# Patient Record
Sex: Male | Born: 1995 | Race: Black or African American | Hispanic: No | Marital: Single | Smoking: Current every day smoker
Health system: Southern US, Community
[De-identification: ages and names within clinical notes are randomized; demographics above are authoritative.]

## PROBLEM LIST (undated history)

## (undated) DIAGNOSIS — J45909 Unspecified asthma, uncomplicated: Secondary | ICD-10-CM

## (undated) DIAGNOSIS — T7840XA Allergy, unspecified, initial encounter: Secondary | ICD-10-CM

## (undated) HISTORY — PX: OTHER SURGICAL HISTORY: SHX169

## (undated) HISTORY — DX: Unspecified asthma, uncomplicated: J45.909

## (undated) HISTORY — DX: Allergy, unspecified, initial encounter: T78.40XA

---
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
## 2016-05-26 ENCOUNTER — Encounter (HOSPITAL_COMMUNITY): Payer: Self-pay | Admitting: Emergency Medicine

## 2016-05-26 ENCOUNTER — Emergency Department (HOSPITAL_COMMUNITY)
Admission: EM | Admit: 2016-05-26 | Discharge: 2016-05-27 | Disposition: A | Discharge status: Home / Self Care | Payer: Self-pay | Attending: Emergency Medicine | Admitting: Emergency Medicine

## 2016-05-26 DIAGNOSIS — F17200 Nicotine dependence, unspecified, uncomplicated: Secondary | ICD-10-CM | POA: Insufficient documentation

## 2016-05-26 DIAGNOSIS — J45909 Unspecified asthma, uncomplicated: Secondary | ICD-10-CM | POA: Insufficient documentation

## 2016-05-26 DIAGNOSIS — R360 Urethral discharge without blood: Secondary | ICD-10-CM | POA: Insufficient documentation

## 2016-05-26 DIAGNOSIS — R369 Urethral discharge, unspecified: Secondary | ICD-10-CM

## 2016-05-26 NOTE — ED Triage Notes (Signed)
Pt to ED from home c/o penile discharge. Denies injury, swelling, or burning with urination. Pt ambulatory, in NAD.

## 2016-05-26 NOTE — ED Provider Notes (Signed)
[facility]-EMERGENCY DEPT Provider Note   CSN: [id number] Arrival date & time: 05/26/16  2321  By signing my name below, I, Rosario Adie, attest that this documentation has been prepared under the direction and in the presence of [facility], [place].  Electronically Signed: Rosario Adie, ED Scribe. 05/26/16. 11:57 PM.  History   Chief Complaint Chief Complaint  Patient presents with  . Penile Discharge   The history is provided by the patient. No language interpreter was used.  Penile Discharge  This is a new problem. The current episode started 3 to 5 hours ago. Episode frequency: intermittently  The problem has not changed since onset.Pertinent negatives include no abdominal pain. Nothing aggravates the symptoms. Nothing relieves the symptoms. He has tried nothing for the symptoms.   HPI Comments: Antonio Torres is a 20 y.o. male who presents to the Emergency Department complaining of intermittent episodes of bloody penile discharge with associated sharp penile pain with ejaculation onset tonight PTA. Pt states that he is currently having unprotected sex with two different partners over the past ~3 months. He reports that he has had Chlamydia in the past w/ similar symptoms. No treatments were tried prior to coming into the ED. No recent injury to the groin area. Denies hematuria, dysuria, fever, chills, enuresis, abdominal pain, nausea, vomiting, groin swelling, penile swelling, or any other associated symptoms.   Past Medical History:  Diagnosis Date  . Allergy   . Asthma    There are no active problems to display for this patient.  Past Surgical History:  Procedure Laterality Date  . forehead stitches      Home Medications    Prior to Admission medications   Medication Sig Start Date End Date Taking? Authorizing Provider  albuterol (PROAIR HFA) 108 (90 BASE) MCG/ACT inhaler Inhale 2 puffs into the lungs every 6 (six) hours as needed. PATIENT NEEDS OFFICE VISIT FOR  ADDITIONAL REFILLS 02/09/14   Godfrey Pick, PA-C  albuterol (PROVENTIL HFA;VENTOLIN HFA) 108 (90 BASE) MCG/ACT inhaler Inhale 2 puffs into the lungs every 4 (four) hours as needed for wheezing or shortness of breath. Patient not taking: Reported on 08/16/2014 07/19/14   Harle Battiest, NP  azithromycin (ZITHROMAX) 250 MG tablet Take 1 tablet (250 mg total) by mouth daily. Take first 2 tablets together, then 1 every day until finished. Patient not taking: Reported on 08/16/2014 07/19/14   Harle Battiest, NP  beclomethasone (QVAR) 40 MCG/ACT inhaler Inhale 2 puffs into the lungs 2 (two) times daily. Patient not taking: Reported on 08/16/2014 01/07/13   Nelva Nay, PA-C  predniSONE (DELTASONE) 20 MG tablet Take 2 tablets (40 mg total) by mouth daily. Patient not taking: Reported on 08/16/2014 07/19/14   Harle Battiest, NP   Family History Family History  Problem Relation Age of Onset  . Miscarriages / [place] Mother   . Asthma Father   . Hypertension Father   . Asthma Brother   . Hypertension Paternal Grandmother    Social History Social History  Substance Use Topics  . Smoking status: Current Some Day Smoker  . Smokeless tobacco: Never Used  . Alcohol use Yes     Comment: Occasionally   Allergies   Review of patient's allergies indicates no known allergies.  Review of Systems Review of Systems  Constitutional: Negative for chills and fever.  Gastrointestinal: Negative for abdominal pain, nausea and vomiting.  Genitourinary: Positive for discharge (bloody) and penile pain (w/ ejaculation). Negative for dysuria, enuresis, hematuria, penile swelling,  scrotal swelling and testicular pain.  All other systems reviewed and are negative.  Physical Exam Updated Vital Signs BP 99/64 (BP Location: Left Arm)   Pulse 67   Temp 97.7 F (36.5 C) (Oral)   Resp 18   SpO2 97%   Physical Exam  Constitutional: He appears well-developed and well-nourished.  HENT:  Head:  Normocephalic.  Eyes: Conjunctivae are normal.  Cardiovascular: Normal rate.   Pulmonary/Chest: Effort normal. No respiratory distress.  Abdominal: He exhibits no distension. Hernia confirmed negative in the right inguinal area and confirmed negative in the left inguinal area.  Genitourinary: Testes normal. Right testis shows no swelling and no tenderness. Left testis shows no swelling and no tenderness. Discharge found.  Genitourinary Comments: Chaperone present throughout entire exam. Bee bee sized nodes in the inguinal bilaterally. Small amount of discharge from the urethra.   Musculoskeletal: Normal range of motion.  Lymphadenopathy: Inguinal adenopathy noted on the right and left side.  Neurological: He is alert.  Skin: Skin is warm and dry.  Psychiatric: He has a normal mood and affect. His behavior is normal.  Nursing note and vitals reviewed.  ED Treatments / Results  DIAGNOSTIC STUDIES: Oxygen Saturation is 97% on RA, normal by my interpretation.   COORDINATION OF CARE: 11:57 PM-Discussed next steps with pt. Pt verbalized understanding and is agreeable with the plan.   Labs (all labs ordered are listed, but only abnormal results are displayed) Labs Reviewed  RPR  HIV ANTIBODY (ROUTINE TESTING)  GC/CHLAMYDIA PROBE AMP ([place]    Procedures Procedures   Medications Ordered in ED Medications  cefTRIAXone (ROCEPHIN) injection 250 mg (not administered)  azithromycin (ZITHROMAX) tablet 1,000 mg (not administered)  lidocaine (PF) (XYLOCAINE) 1 % injection (not administered)    Initial Impression / Assessment and Plan / ED Course  I have reviewed the triage vital signs and the nursing notes.  Pertinent lab results that were available during my care of the patient were reviewed by me and considered in my medical decision making (see chart for details).  Clinical Course    Patient treated in the ED for STI with Rocephin and Zithromax. Patient advised  to inform and treat all sexual partners.  Pt advised on safe sex practices and understands that they have GC/Chlamydia cultures pending and will result in 2-3 days. HIV and RPR sent. Pt encouraged to follow up at local health department for future STI checks. No concern for prostatitis or epididymitis. Discussed return precautions. Pt appears safe for discharge. Pt is comfortable with above plan and is stable for discharge at this time. All questions were answered prior to disposition.   Final Clinical Impressions(s) / ED Diagnoses   Final diagnoses:  Penile discharge   New Prescriptions New Prescriptions   No medications on file  I personally performed the services described in this documentation, which was scribed in my presence. The recorded information has been reviewed and is accurate.     [address]Hope AlvaM Mckala Pantaleon, NP 05/27/16 [id number]    Linwood DibblesJon Knapp, MD 05/28/16 2241

## 2016-05-26 NOTE — ED Notes (Signed)
NP at bedside.

## 2016-05-27 LAB — HIV ANTIBODY (ROUTINE TESTING W REFLEX): HIV Screen 4th Generation wRfx: NONREACTIVE

## 2016-05-27 LAB — RPR: RPR: NONREACTIVE

## 2016-05-27 MED ORDER — CEFTRIAXONE SODIUM 250 MG IJ SOLR
250.0000 mg | Freq: Once | INTRAMUSCULAR | Status: AC
  Administered 2016-05-27: 250 mg via INTRAMUSCULAR
  Filled 2016-05-27: qty 250

## 2016-05-27 MED ORDER — LIDOCAINE HCL (PF) 1 % IJ SOLN
INTRAMUSCULAR | Status: AC
  Administered 2016-05-27: 5 mL
  Filled 2016-05-27: qty 5

## 2016-05-27 MED ORDER — AZITHROMYCIN 250 MG PO TABS
1000.0000 mg | ORAL_TABLET | Freq: Once | ORAL | Status: AC
  Administered 2016-05-27: 1000 mg via ORAL
  Filled 2016-05-27: qty 4

## 2016-05-27 NOTE — Discharge Instructions (Signed)
If your symptoms persist follow up with the urologist, Dr. Berneice HeinrichManny.

## 2016-05-27 NOTE — ED Notes (Signed)
Patient verbalized understanding of discharge instructions and denies any further needs or questions at this time. VS stable. Patient ambulatory with steady gait, declined wheelchair.  

## 2016-05-28 ENCOUNTER — Telehealth (HOSPITAL_COMMUNITY): Payer: Self-pay

## 2016-05-28 LAB — GC/CHLAMYDIA PROBE AMP (~~LOC~~) NOT AT ARMC
Chlamydia: POSITIVE — AB
NEISSERIA GONORRHEA: NEGATIVE

## 2016-05-28 NOTE — Telephone Encounter (Signed)
[facility]  (+) Chlamydia  Pt treated with Zithromax and Rocephin.    05/28/16 @0945   Pt ID verified x 2.  Pt informed of dx, tx rcvd appropriate, notify partner & abstain from sex x 10 days.  [employer] form completed and faxed.

## 2016-11-05 ENCOUNTER — Encounter (HOSPITAL_COMMUNITY): Payer: Self-pay | Admitting: Emergency Medicine

## 2016-11-05 ENCOUNTER — Ambulatory Visit (HOSPITAL_COMMUNITY)
Admission: EM | Admit: 2016-11-05 | Discharge: 2016-11-05 | Disposition: A | Discharge status: Home / Self Care | Payer: Medicaid - Out of State | Attending: Family Medicine | Admitting: Family Medicine

## 2016-11-05 DIAGNOSIS — Z711 Person with feared health complaint in whom no diagnosis is made: Secondary | ICD-10-CM

## 2016-11-05 DIAGNOSIS — R369 Urethral discharge, unspecified: Secondary | ICD-10-CM

## 2016-11-05 DIAGNOSIS — Z113 Encounter for screening for infections with a predominantly sexual mode of transmission: Secondary | ICD-10-CM

## 2016-11-05 DIAGNOSIS — J45909 Unspecified asthma, uncomplicated: Secondary | ICD-10-CM | POA: Insufficient documentation

## 2016-11-05 DIAGNOSIS — N509 Disorder of male genital organs, unspecified: Secondary | ICD-10-CM

## 2016-11-05 DIAGNOSIS — Z202 Contact with and (suspected) exposure to infections with a predominantly sexual mode of transmission: Secondary | ICD-10-CM | POA: Insufficient documentation

## 2016-11-05 DIAGNOSIS — R590 Localized enlarged lymph nodes: Secondary | ICD-10-CM

## 2016-11-05 DIAGNOSIS — F17200 Nicotine dependence, unspecified, uncomplicated: Secondary | ICD-10-CM | POA: Insufficient documentation

## 2016-11-05 DIAGNOSIS — R591 Generalized enlarged lymph nodes: Secondary | ICD-10-CM

## 2016-11-05 LAB — CBC WITH DIFFERENTIAL/PLATELET
Basophils Absolute: 0 10*3/uL (ref 0.0–0.1)
Basophils Relative: 1 %
Eosinophils Absolute: 0.3 10*3/uL (ref 0.0–0.7)
Eosinophils Relative: 4 %
HCT: 46.3 % (ref 39.0–52.0)
Hemoglobin: 15.8 g/dL (ref 13.0–17.0)
Lymphocytes Relative: 28 %
Lymphs Abs: 1.6 10*3/uL (ref 0.7–4.0)
MCH: 29.8 pg (ref 26.0–34.0)
MCHC: 34.1 g/dL (ref 30.0–36.0)
MCV: 87.4 fL (ref 78.0–100.0)
MONOS PCT: 9 %
Monocytes Absolute: 0.5 10*3/uL (ref 0.1–1.0)
Neutro Abs: 3.4 10*3/uL (ref 1.7–7.7)
Neutrophils Relative %: 58 %
PLATELETS: 213 10*3/uL (ref 150–400)
RBC: 5.3 MIL/uL (ref 4.22–5.81)
RDW: 13.6 % (ref 11.5–15.5)
WBC: 5.9 10*3/uL (ref 4.0–10.5)

## 2016-11-05 MED ORDER — AZITHROMYCIN 250 MG PO TABS
ORAL_TABLET | ORAL | Status: AC
  Filled 2016-11-05: qty 4

## 2016-11-05 MED ORDER — LIDOCAINE HCL (PF) 1 % IJ SOLN
INTRAMUSCULAR | Status: AC
  Filled 2016-11-05: qty 2

## 2016-11-05 MED ORDER — CEFTRIAXONE SODIUM 250 MG IJ SOLR
250.0000 mg | Freq: Once | INTRAMUSCULAR | Status: AC
  Administered 2016-11-05: 250 mg via INTRAMUSCULAR

## 2016-11-05 MED ORDER — CEFTRIAXONE SODIUM 250 MG IJ SOLR
INTRAMUSCULAR | Status: AC
  Filled 2016-11-05: qty 250

## 2016-11-05 MED ORDER — AZITHROMYCIN 250 MG PO TABS
1000.0000 mg | ORAL_TABLET | Freq: Once | ORAL | Status: AC
  Administered 2016-11-05: 1000 mg via ORAL

## 2016-11-05 NOTE — Discharge Instructions (Signed)
We are testing you today for gonorrhea, chlamydia, HIV, and syphilis. Because if your lymphadenopathy, I'm also doing a CBC with differential, this test checks your white blood cell count. He also treating you today for possible STD exposure, human given azithromycin, and Rocephin. You'll be notified of the results in 3-5 business days. If your symptoms persist, follow-up with a primary care provider, or return to clinic.

## 2016-11-05 NOTE — ED Triage Notes (Signed)
Here for STD check  Reports he has small, painless mass on groin area onset 3 days  Denies penile d/c, dysuria, fevers  Sexually active and is not consistent w/condom use  A&O x4... NAD

## 2016-11-05 NOTE — ED Provider Notes (Signed)
CSN: [id number]     Arrival date & time 11/05/16  1010 History   First MD Initiated Contact with Patient 11/05/16 1122     Chief Complaint  Patient presents with  . Exposure to STD   (Consider location/radiation/quality/duration/timing/severity/associated sxs/prior Treatment) 21 year old male presents to clinic with concern of having swollen lymph nodes in his groin, no also reports having a "clear" penile discharge when he has an erection. He is concerned about possible exposure to STDs, he is concerned about his lymph nodes. He has no rashes, lesions, or growths or other skin conditions on his penis, testicles, or groin.   The history is provided by the patient.  Exposure to STD  This is a new problem. The current episode started more than 1 week ago. The problem occurs constantly. The problem has been gradually worsening. Pertinent negatives include no chest pain and no abdominal pain. Nothing aggravates the symptoms. Nothing relieves the symptoms. He has tried nothing for the symptoms.    Past Medical History:  Diagnosis Date  . Allergy   . Asthma    Past Surgical History:  Procedure Laterality Date  . forehead stitches     Family History  Problem Relation Age of Onset  . Miscarriages / [place] Mother   . Asthma Father   . Hypertension Father   . Asthma Brother   . Hypertension Paternal Grandmother    Social History  Substance Use Topics  . Smoking status: Current Some Day Smoker  . Smokeless tobacco: Never Used  . Alcohol use Yes     Comment: Occasionally    Review of Systems  Constitutional: Negative for chills, fatigue and fever.  Cardiovascular: Negative for chest pain, palpitations and leg swelling.  Gastrointestinal: Negative for abdominal pain, constipation, diarrhea, nausea and vomiting.  Genitourinary: Positive for discharge. Negative for difficulty urinating, dysuria, flank pain, frequency, genital sores, penile pain and penile swelling.   Musculoskeletal: Negative for back pain, myalgias, neck pain and neck stiffness.  Skin: Negative for color change and rash.  Neurological: Negative for dizziness, facial asymmetry and weakness.  Hematological: Positive for adenopathy.  All other systems reviewed and are negative.   Allergies  Patient has no known allergies.  Home Medications   Prior to Admission medications   Medication Sig Start Date End Date Taking? Authorizing Provider  albuterol (PROAIR HFA) 108 (90 BASE) MCG/ACT inhaler Inhale 2 puffs into the lungs every 6 (six) hours as needed. PATIENT NEEDS OFFICE VISIT FOR ADDITIONAL REFILLS 02/09/14   Godfrey PickEleanore E Egan, PA-C  albuterol (PROVENTIL HFA;VENTOLIN HFA) 108 (90 BASE) MCG/ACT inhaler Inhale 2 puffs into the lungs every 4 (four) hours as needed for wheezing or shortness of breath. Patient not taking: Reported on 08/16/2014 07/19/14   Harle BattiestElizabeth Tysinger, NP  beclomethasone (QVAR) 40 MCG/ACT inhaler Inhale 2 puffs into the lungs 2 (two) times daily. Patient not taking: Reported on 08/16/2014 01/07/13   Nelva NayHeather M Marte, PA-C   Meds Ordered and Administered this Visit   Medications  cefTRIAXone (ROCEPHIN) injection 250 mg (250 mg Intramuscular Given 11/05/16 1150)  azithromycin (ZITHROMAX) tablet 1,000 mg (1,000 mg Oral Given 11/05/16 1148)    BP (!) 111/39 (BP Location: Right Arm)   Pulse 64   Temp 97.9 F (36.6 C) (Oral)   Resp 20   SpO2 100%  No data found.   Physical Exam  Constitutional: He is oriented to person, place, and time. He appears well-developed and well-nourished. No distress.  Cardiovascular: Normal rate and regular rhythm.  Pulmonary/Chest: Effort normal and breath sounds normal.  Abdominal: Soft. Bowel sounds are normal. He exhibits no distension. There is no tenderness. There is no guarding. Hernia confirmed negative in the right inguinal area and confirmed negative in the left inguinal area.  Genitourinary: Testes normal and penis normal. Right  testis shows no mass, no swelling and no tenderness. Left testis shows no mass, no swelling and no tenderness. Circumcised. No penile erythema. No discharge found.  Lymphadenopathy: Inguinal adenopathy noted on the right and left side.       Right: Inguinal adenopathy present.       Left: Inguinal adenopathy present.  Neurological: He is alert and oriented to person, place, and time.  Skin: Skin is warm and dry. Capillary refill takes less than 2 seconds. He is not diaphoretic.  Psychiatric: He has a normal mood and affect.  Nursing note and vitals reviewed.   Urgent Care Course     Procedures (including critical care time)  Labs Review Labs Reviewed  CBC WITH DIFFERENTIAL/PLATELET  HIV ANTIBODY (ROUTINE TESTING)  RPR  URINE CYTOLOGY ANCILLARY ONLY    Imaging Review No results found.      MDM   1. Concern about STD in male without diagnosis   2. Lymphadenopathy    Urine cytology collected, blood drawn to check for HIV and RPR. CBC with differential obtained, no abnormalities on the CBC, is treated with azithromycin, and Rocephin in clinic, we'll notify with the results of the tests in 3-5 business days    Dorena Bodo, NP 11/05/16 1337

## 2016-11-06 LAB — URINE CYTOLOGY ANCILLARY ONLY
CHLAMYDIA, DNA PROBE: NEGATIVE
Neisseria Gonorrhea: NEGATIVE
Trichomonas: NEGATIVE

## 2016-11-06 LAB — RPR: RPR Ser Ql: NONREACTIVE

## 2016-11-06 LAB — HIV ANTIBODY (ROUTINE TESTING W REFLEX): HIV SCREEN 4TH GENERATION: NONREACTIVE

## 2016-11-09 LAB — URINE CYTOLOGY ANCILLARY ONLY: BACTERIAL VAGINITIS: NEGATIVE

## 2017-01-17 ENCOUNTER — Encounter (HOSPITAL_COMMUNITY): Payer: Self-pay | Admitting: Emergency Medicine

## 2017-01-17 ENCOUNTER — Ambulatory Visit (HOSPITAL_COMMUNITY)
Admission: EM | Admit: 2017-01-17 | Discharge: 2017-01-17 | Disposition: A | Discharge status: Home / Self Care | Payer: Self-pay | Attending: Internal Medicine | Admitting: Internal Medicine

## 2017-01-17 DIAGNOSIS — Z113 Encounter for screening for infections with a predominantly sexual mode of transmission: Secondary | ICD-10-CM

## 2017-01-17 DIAGNOSIS — R591 Generalized enlarged lymph nodes: Secondary | ICD-10-CM | POA: Insufficient documentation

## 2017-01-17 DIAGNOSIS — J45909 Unspecified asthma, uncomplicated: Secondary | ICD-10-CM | POA: Insufficient documentation

## 2017-01-17 DIAGNOSIS — F17200 Nicotine dependence, unspecified, uncomplicated: Secondary | ICD-10-CM | POA: Insufficient documentation

## 2017-01-17 DIAGNOSIS — R369 Urethral discharge, unspecified: Secondary | ICD-10-CM

## 2017-01-17 LAB — BASIC METABOLIC PANEL
ANION GAP: 7 (ref 5–15)
BUN: 7 mg/dL (ref 6–20)
CO2: 27 mmol/L (ref 22–32)
Calcium: 9.9 mg/dL (ref 8.9–10.3)
Chloride: 104 mmol/L (ref 101–111)
Creatinine, Ser: 1.09 mg/dL (ref 0.61–1.24)
GFR calc non Af Amer: >60 mL/min (ref >60)
Glucose, Bld: 93 mg/dL (ref 65–99)
POTASSIUM: 4 mmol/L (ref 3.5–5.1)
Sodium: 138 mmol/L (ref 135–145)

## 2017-01-17 LAB — CBC WITH DIFFERENTIAL/PLATELET
Basophils Absolute: 0 10*3/uL (ref 0.0–0.1)
Basophils Relative: 0 %
Eosinophils Absolute: 0.3 10*3/uL (ref 0.0–0.7)
Eosinophils Relative: 5 %
HEMATOCRIT: 51.2 % (ref 39.0–52.0)
HEMOGLOBIN: 17.3 g/dL — AB (ref 13.0–17.0)
Lymphocytes Relative: 25 %
Lymphs Abs: 1.8 10*3/uL (ref 0.7–4.0)
MCH: 29.5 pg (ref 26.0–34.0)
MCHC: 33.8 g/dL (ref 30.0–36.0)
MCV: 87.4 fL (ref 78.0–100.0)
Monocytes Absolute: 0.6 10*3/uL (ref 0.1–1.0)
Monocytes Relative: 8 %
NEUTROS ABS: 4.4 10*3/uL (ref 1.7–7.7)
NEUTROS PCT: 62 %
Platelets: 202 10*3/uL (ref 150–400)
RBC: 5.86 MIL/uL — AB (ref 4.22–5.81)
RDW: 13 % (ref 11.5–15.5)
WBC: 7.1 10*3/uL (ref 4.0–10.5)

## 2017-01-17 LAB — SEDIMENTATION RATE: SED RATE: 1 mm/h (ref 0–16)

## 2017-01-17 MED ORDER — STERILE WATER FOR INJECTION IJ SOLN
INTRAMUSCULAR | Status: AC
  Filled 2017-01-17: qty 10

## 2017-01-17 MED ORDER — CIPROFLOXACIN HCL 500 MG PO TABS: 500.0000 mg | ORAL_TABLET | Freq: Two times a day (BID) | ORAL | 0 refills | Status: DC

## 2017-01-17 MED ORDER — AZITHROMYCIN 250 MG PO TABS
1000.0000 mg | ORAL_TABLET | Freq: Once | ORAL | Status: AC
  Administered 2017-01-17: 1000 mg via ORAL

## 2017-01-17 MED ORDER — AZITHROMYCIN 250 MG PO TABS
ORAL_TABLET | ORAL | Status: AC
  Filled 2017-01-17: qty 4

## 2017-01-17 MED ORDER — CEFTRIAXONE SODIUM 250 MG IJ SOLR
250.0000 mg | Freq: Once | INTRAMUSCULAR | Status: AC
  Administered 2017-01-17: 250 mg via INTRAMUSCULAR

## 2017-01-17 MED ORDER — CEFTRIAXONE SODIUM 250 MG IJ SOLR
INTRAMUSCULAR | Status: AC
  Filled 2017-01-17: qty 250

## 2017-01-17 NOTE — Discharge Instructions (Signed)
For your lymphadenopathy, multiple tests have been drawn and run, if there is anything abnormal, we will notify you of the results in 3-5 business days. I recommend you take a daily antihistamine such as Claritin, allegra, or Zyrtec daily, and I have started you on a broad spectrum antibiotic called Cipro, if your lymphadenopathy persists, I have attached the contact information for community health and wellness, he may need further testing and evaluation beyond what is available here in the urgent care setting. Contact them to set up an appointment to establish for primary care.

## 2017-01-17 NOTE — ED Provider Notes (Signed)
CSN: [id number]     Arrival date & time 01/17/17  1226 History   First MD Initiated Contact with Patient 01/17/17 1330     Chief Complaint  Patient presents with  . SEXUALLY TRANSMITTED DISEASE   (Consider location/radiation/quality/duration/timing/severity/associated sxs/prior Treatment) 21 year old male presents to clinic with a chief complaint of concern of possible sexually transmitted disease. I saw this patient on 11/05/2016, at that time he had a chief complaint of swollen lymph nodes in his groin area, and a clear penile discharge. He was treated for, and tested for various STDs living gonorrhea, chlamydia, HIV, syphilis, and Trichomonas. He tested negative for all of these infections, however this lymphadenopathy has remained. He is here today because he is concerned about these lymph nodes, and he wants to be retested, and "treated" for everything. He denies being sexually active in the last 2 months, and states that the few times he had intercourse after the last visit he had consistent condom usage. He denies any dysuria, and he denies any penile discharge. He does state he has a small "bump" on his scrotum, he mashed earlier today and various "white stuff" came out. He denies any fever, chills, loss of appetite, nausea, vomiting, or other systemic symptoms.   The history is provided by the patient.    Past Medical History:  Diagnosis Date  . Allergy   . Asthma    Past Surgical History:  Procedure Laterality Date  . forehead stitches     Family History  Problem Relation Age of Onset  . Miscarriages / [place] Mother   . Asthma Father   . Hypertension Father   . Asthma Brother   . Hypertension Paternal Grandmother    Social History  Substance Use Topics  . Smoking status: Current Some Day Smoker  . Smokeless tobacco: Never Used  . Alcohol use Yes     Comment: Occasionally    Review of Systems  Constitutional: Negative.   HENT: Negative.   Respiratory: Negative.    Cardiovascular: Negative.   Gastrointestinal: Negative.   Genitourinary: Negative.   Musculoskeletal: Negative.   Skin: Negative.   Neurological: Negative.   Hematological: Positive for adenopathy.    Allergies  Patient has no known allergies.  Home Medications   Prior to Admission medications   Medication Sig Start Date End Date Taking? Authorizing Provider  albuterol (PROAIR HFA) 108 (90 BASE) MCG/ACT inhaler Inhale 2 puffs into the lungs every 6 (six) hours as needed. PATIENT NEEDS OFFICE VISIT FOR ADDITIONAL REFILLS 02/09/14  Yes Elsie StainEgan, Eleanore E, PA-C  ciprofloxacin (CIPRO) 500 MG tablet Take 1 tablet (500 mg total) by mouth 2 (two) times daily. 01/17/17   Dorena BodoKennard, Rayhaan Huster, NP   Meds Ordered and Administered this Visit   Medications  cefTRIAXone (ROCEPHIN) injection 250 mg (250 mg Intramuscular Given 01/17/17 1406)  azithromycin (ZITHROMAX) tablet 1,000 mg (1,000 mg Oral Given 01/17/17 1405)    BP 115/60 (BP Location: Left Arm)   Pulse 64   Temp 98.5 F (36.9 C) (Oral)   Resp 16   SpO2 100%  No data found.   Physical Exam  Constitutional: He is oriented to person, place, and time. He appears well-developed and well-nourished. No distress.  HENT:  Head: Normocephalic.  Right Ear: External ear normal.  Left Ear: External ear normal.  Eyes: Conjunctivae are normal.  Cardiovascular: Normal rate and regular rhythm.   Pulmonary/Chest: Effort normal and breath sounds normal.  Genitourinary: Testes normal. Circumcised. No penile erythema. No discharge found.  Lymphadenopathy:       Head (right side): Submandibular adenopathy present.       Head (left side): Submandibular adenopathy present.    He has cervical adenopathy.       Right cervical: Deep cervical adenopathy present.       Right axillary: No pectoral and no lateral adenopathy present.       Left axillary: No pectoral and no lateral adenopathy present.Inguinal adenopathy noted on the right and left side.         Right: Inguinal adenopathy present. No supraclavicular and no epitrochlear adenopathy present.       Left: Inguinal adenopathy present. No supraclavicular and no epitrochlear adenopathy present.  Neurological: He is alert and oriented to person, place, and time.  Skin: Skin is warm and dry. Capillary refill takes less than 2 seconds. He is not diaphoretic.  Psychiatric: He has a normal mood and affect. His behavior is normal.  Nursing note and vitals reviewed.   Urgent Care Course     Procedures (including critical care time)  Labs Review Labs Reviewed  CBC WITH DIFFERENTIAL/PLATELET - Abnormal; Notable for the following:       Result Value   RBC 5.86 (*)    Hemoglobin 17.3 (*)    All other components within normal limits  AEROBIC CULTURE (SUPERFICIAL SPECIMEN)  HSV CULTURE AND TYPING  BASIC METABOLIC PANEL  SEDIMENTATION RATE  RPR  HIV ANTIBODY (ROUTINE TESTING)  URINE CYTOLOGY ANCILLARY ONLY    Imaging Review No results found.      MDM   1. Lymphadenopathy, generalized     Multiple labs drawn, treated in clinic for gonorrhea, and chlamydia. Testing for multiple types of infectious organisms, started on over-the-counter antihistamines, short course of broad-spectrum antibiotics, provided contact information for community health and wellness, encouraged him to follow up with him to establish for primary care, and for further evaluation and investigation of this lymphadenopathy if it persists despite treatment.    Dorena Bodo, NP 01/17/17 2113

## 2017-01-17 NOTE — ED Triage Notes (Signed)
[facility] a [facility] feeling a "BB" in his groin area and believed it to possibly be an STD.

## 2017-01-18 LAB — RPR: RPR Ser Ql: NONREACTIVE

## 2017-01-18 LAB — HIV ANTIBODY (ROUTINE TESTING W REFLEX): HIV Screen 4th Generation wRfx: NONREACTIVE

## 2017-01-19 LAB — AEROBIC CULTURE W GRAM STAIN (SUPERFICIAL SPECIMEN): Culture: NORMAL

## 2017-01-19 LAB — HSV CULTURE AND TYPING

## 2017-01-19 LAB — AEROBIC CULTURE  (SUPERFICIAL SPECIMEN): GRAM STAIN: NONE SEEN

## 2017-03-30 ENCOUNTER — Ambulatory Visit (HOSPITAL_COMMUNITY)
Admission: EM | Admit: 2017-03-30 | Discharge: 2017-03-30 | Disposition: A | Discharge status: Home / Self Care | Payer: Self-pay | Attending: Family Medicine | Admitting: Family Medicine

## 2017-03-30 ENCOUNTER — Encounter (HOSPITAL_COMMUNITY): Payer: Self-pay

## 2017-03-30 ENCOUNTER — Encounter (HOSPITAL_COMMUNITY): Payer: Self-pay | Admitting: Emergency Medicine

## 2017-03-30 ENCOUNTER — Emergency Department (HOSPITAL_COMMUNITY)
Admission: EM | Admit: 2017-03-30 | Discharge: 2017-03-30 | Disposition: A | Discharge status: Home / Self Care | Payer: Self-pay | Attending: Emergency Medicine | Admitting: Emergency Medicine

## 2017-03-30 DIAGNOSIS — R590 Localized enlarged lymph nodes: Secondary | ICD-10-CM | POA: Insufficient documentation

## 2017-03-30 DIAGNOSIS — J45909 Unspecified asthma, uncomplicated: Secondary | ICD-10-CM | POA: Insufficient documentation

## 2017-03-30 DIAGNOSIS — Z79899 Other long term (current) drug therapy: Secondary | ICD-10-CM | POA: Insufficient documentation

## 2017-03-30 DIAGNOSIS — N492 Inflammatory disorders of scrotum: Secondary | ICD-10-CM | POA: Insufficient documentation

## 2017-03-30 DIAGNOSIS — F17210 Nicotine dependence, cigarettes, uncomplicated: Secondary | ICD-10-CM | POA: Insufficient documentation

## 2017-03-30 MED ORDER — SULFAMETHOXAZOLE-TRIMETHOPRIM 800-160 MG PO TABS: 1.0000 | ORAL_TABLET | Freq: Two times a day (BID) | ORAL | 0 refills | Status: AC

## 2017-03-30 NOTE — ED Provider Notes (Signed)
[facility]-EMERGENCY DEPT Provider Note   CSN: [id number] Arrival date & time: 03/30/17  1323    History   Chief Complaint Chief Complaint  Patient presents with  . Lymphadenopathy    HPI Antonio Torres is a 21 y.o. male.  HPI   21 year old Male presents today with scrotal swelling. Patient notes that 4 days ago he started developing swelling to his left groin and upper scrotum. Patient notes he's had these in the past, was diagnosed with lymphadenopathy, but was also diagnosed with swelling of unknown etiology. Patient reports he also has an area on the right side of his scrotum that has been there for several weeks and, he describes as hard, with no surrounding redness, non-painful. Patient reports very minimal redness overlying the area of hardness. He denies any penile discharge, testicular pain or swelling, abdominal pain, nausea vomiting fever or any systemic illnesses.  Past Medical History:  Diagnosis Date  . Allergy   . Asthma     There are no active problems to display for this patient.   Past Surgical History:  Procedure Laterality Date  . forehead stitches         Home Medications    Prior to Admission medications   Medication Sig Start Date End Date Taking? Authorizing Provider  albuterol (PROAIR HFA) 108 (90 BASE) MCG/ACT inhaler Inhale 2 puffs into the lungs every 6 (six) hours as needed. PATIENT NEEDS OFFICE VISIT FOR ADDITIONAL REFILLS 02/09/14   Godfrey Pick, PA-C  ciprofloxacin (CIPRO) 500 MG tablet Take 1 tablet (500 mg total) by mouth 2 (two) times daily. 01/17/17   Dorena Bodo, NP  sulfamethoxazole-trimethoprim (BACTRIM DS,SEPTRA DS) 800-160 MG tablet Take 1 tablet by mouth 2 (two) times daily. 03/30/17 04/06/17  Eyvonne Mechanic, PA-C    Family History Family History  Problem Relation Age of Onset  . Miscarriages / [place] Mother   . Asthma Father   . Hypertension Father   . Asthma Brother   . Hypertension Paternal Grandmother      Social History Social History  Substance Use Topics  . Smoking status: Current Some Day Smoker  . Smokeless tobacco: Never Used  . Alcohol use Yes     Comment: Occasionally     Allergies   Patient has no known allergies.   Review of Systems Review of Systems  All other systems reviewed and are negative.    Physical Exam Updated Vital Signs BP 109/62   Pulse 61   Temp 97.7 F (36.5 C) (Oral)   Resp 16   Ht 5\' 7"  (1.702 m)   Wt 57.6 kg (127 lb)   SpO2 100%   BMI 19.89 kg/m   Physical Exam  Constitutional: He is oriented to person, place, and time. He appears well-developed and well-nourished.  HENT:  Head: Normocephalic and atraumatic.  Eyes: Pupils are equal, round, and reactive to light. Conjunctivae are normal. Right eye exhibits no discharge. Left eye exhibits no discharge. No scleral icterus.  Neck: Normal range of motion. No JVD present. No tracheal deviation present.  Pulmonary/Chest: Effort normal. No stridor.  Genitourinary:  Genitourinary Comments: Induration with small amount of overriding cellulitis to the left scrotum- testicular exam normal with nontender testicles no penile discharge- no inguinal lymphadenopathy  Neurological: He is alert and oriented to person, place, and time. Coordination normal.  Psychiatric: He has a normal mood and affect. His behavior is normal. Judgment and thought content normal.  Nursing note and vitals reviewed.    ED Treatments /  Results  Labs (all labs ordered are listed, but only abnormal results are displayed) Labs Reviewed - No data to display  EKG  EKG Interpretation None       Radiology No results found.  Procedures Procedures (including critical care time)  Medications Ordered in ED Medications - No data to display   Initial Impression / Assessment and Plan / ED Course  I have reviewed the triage vital signs and the nursing notes.  Pertinent labs & imaging results that were available during  my care of the patient were reviewed by me and considered in my medical decision making (see chart for details).      Final Clinical Impressions(s) / ED Diagnoses   Final diagnoses:  Scrotal abscess     Discharge Meds:  Bactrim  Assessment/Plan: 21 year old male presents today with likely infection of the scrotum. He is an area of induration, no fluid collection noted on bedside ultrasound. He has no signs of significant surrounding cellulitis, no signs of Fournier's. Patient is afebrile nontoxic well-appearing. He has a history of the same. Patient will be started on antibiotics, he will follow-up as an outpatient with urology, return immediately if any new or worsening signs or symptoms present. Patient verbalized understanding and agreement to today's plan had no further questions or concerns at time of discharge.      New Prescriptions Discharge Medication List as of 03/30/2017  5:20 PM    START taking these medications   Details  sulfamethoxazole-trimethoprim (BACTRIM DS,SEPTRA DS) 800-160 MG tablet Take 1 tablet by mouth 2 (two) times daily., Starting Tue 03/30/2017, Until Tue 04/06/2017, Print         Kosha Jaquith, [place], PA-C 03/30/17 2039    Geoffery Lyons, MD 03/31/17 1115

## 2017-03-30 NOTE — Discharge Instructions (Signed)
Please read attached information. If you experience any new or worsening signs or symptoms please return to the emergency room for evaluation. Please follow-up with your primary care provider or specialist as discussed. Please use medication prescribed only as directed and discontinue taking if you have any concerning signs or symptoms.   °

## 2017-03-30 NOTE — ED Notes (Signed)
Declined W/C at D/C and was escorted to lobby by RN. 

## 2017-03-30 NOTE — ED Triage Notes (Signed)
PT reports he has been tested twice for STDs and he is negative. PT has enlarged lymph nodes for 1-2 weeks. PT has a "lump" to left groin. PT had similar issues in June and had multiple STD checks. PT denies body aches. PT reports possible fevers.

## 2017-03-30 NOTE — ED Triage Notes (Signed)
Pt sent from u/c for onset 4 days enlarged lump in left groin.  Pt has had two episodes of lumps in groin and STD checks at u/c.  No fevers, abd pain, back pain, or urinary symptoms.

## 2017-03-31 LAB — URINE CYTOLOGY ANCILLARY ONLY
CHLAMYDIA, DNA PROBE: NEGATIVE
NEISSERIA GONORRHEA: NEGATIVE

## 2017-03-31 NOTE — ED Provider Notes (Signed)
[facility]   [id number] 03/30/17 Arrival Time: 1002  ASSESSMENT & PLAN:  1. Lymphadenopathy, inguinal    Unsure of exact etiology but his is apparently not an acute problem. Workup so far has been negative. Discussed that he might benefit from imaging. Will establish with PCP first. No treatment today.  Reviewed expectations re: course of current medical issues. Questions answered. Outlined signs and symptoms indicating need for more acute intervention. Patient verbalized understanding. After Visit Summary given.   SUBJECTIVE:  ATAVION REDSTONE is a 21 y.o. male who presents with complaint of upper scrotal firmness or swelling. For several weeks. No significant pain reported. "A little uncomfortable sometimes." Has been worried about STDs but all testing has been negative. Wants to be tested for GC/Chlamydia again today. No penile d/c. No urinary symptoms or hematuria. Is sexually active with occasional condom use. No abdominal symptoms. Afebrile. No rashes.  ROS: As per HPI.   OBJECTIVE:  Vitals:   03/30/17 1051 03/30/17 1052  BP:  (!) 135/99  Pulse:  64  Resp:  16  Temp:  98.4 F (36.9 C)  TempSrc:  Oral  SpO2:  100%  Weight: 125 lb (56.7 kg)   Height: 5\' 7"  (1.702 m)      General appearance: alert; no distress Abdomen: soft, non-tender; bowel sounds normal; no masses or organomegaly; no guarding or rebound tenderness GU: both testicles feel normal; upper scrotum bilaterally with slight thickening; non-tender; no masses appreciated; no hernia; tolerates exam well Back: no CVA tenderness Extremities: no cyanosis or edema; symmetrical with no gross deformities Skin: warm and dry Psychological:  alert and cooperative; normal mood and affect  Results for orders placed or performed during the hospital encounter of 01/17/17  Wound or Superficial Culture  Result Value Ref Range   Specimen Description PUSTULE    Special Requests NONE    Gram Stain NO WBC  SEEN NO ORGANISMS SEEN     Culture NORMAL SKIN FLORA    Report Status 01/19/2017 FINAL   Hsv Culture And Typing  Result Value Ref Range   HSV Culture/Type Comment    Source of Sample PUSTULE   CBC with Differential  Result Value Ref Range   WBC 7.1 4.0 - 10.5 K/uL   RBC 5.86 (H) 4.22 - 5.81 MIL/uL   Hemoglobin 17.3 (H) 13.0 - 17.0 g/dL   HCT 79.3 90.3 - 00.9 %   MCV 87.4 78.0 - 100.0 fL   MCH 29.5 26.0 - 34.0 pg   MCHC 33.8 30.0 - 36.0 g/dL   RDW 23.3 00.7 - 62.2 %   Platelets 202 150 - 400 K/uL   Neutrophils Relative % 62 %   Neutro Abs 4.4 1.7 - 7.7 K/uL   Lymphocytes Relative 25 %   Lymphs Abs 1.8 0.7 - 4.0 K/uL   Monocytes Relative 8 %   Monocytes Absolute 0.6 0.1 - 1.0 K/uL   Eosinophils Relative 5 %   Eosinophils Absolute 0.3 0.0 - 0.7 K/uL   Basophils Relative 0 %   Basophils Absolute 0.0 0.0 - 0.1 K/uL  Basic metabolic panel  Result Value Ref Range   Sodium 138 135 - 145 mmol/L   Potassium 4.0 3.5 - 5.1 mmol/L   Chloride 104 101 - 111 mmol/L   CO2 27 22 - 32 mmol/L   Glucose, Bld 93 65 - 99 mg/dL   BUN 7 6 - 20 mg/dL   Creatinine, Ser 6.33 0.61 - 1.24 mg/dL   Calcium 9.9 8.9 -  10.3 mg/dL   GFR calc non Af Amer >60 >60 mL/min   GFR calc Af Amer >60 >60 mL/min   Anion gap 7 5 - 15  RPR  Result Value Ref Range   RPR Ser Ql Non Reactive Non Reactive  HIV antibody  Result Value Ref Range   HIV Screen 4th Generation wRfx Non Reactive Non Reactive  Sedimentation rate  Result Value Ref Range   Sed Rate 1 0 - 16 mm/hr    Labs Reviewed  URINE CYTOLOGY ANCILLARY ONLY    No Known Allergies  PMHx, SurgHx, SocialHx, Medications, and Allergies were reviewed in the Visit Navigator and updated as appropriate.      Mardella Layman, MD 03/31/17 1000

## 2018-01-06 ENCOUNTER — Inpatient Hospital Stay (HOSPITAL_COMMUNITY)
Admission: EM | Admit: 2018-01-06 | Discharge: 2018-01-17 | DRG: 579 | Disposition: A | Discharge status: Home Health | Payer: Medicaid Other | Attending: General Surgery | Admitting: General Surgery

## 2018-01-06 DIAGNOSIS — M21372 Foot drop, left foot: Secondary | ICD-10-CM | POA: Diagnosis present

## 2018-01-06 DIAGNOSIS — R58 Hemorrhage, not elsewhere classified: Secondary | ICD-10-CM | POA: Diagnosis present

## 2018-01-06 DIAGNOSIS — S75101A Unspecified injury of femoral vein at hip and thigh level, right leg, initial encounter: Secondary | ICD-10-CM | POA: Diagnosis present

## 2018-01-06 DIAGNOSIS — D62 Acute posthemorrhagic anemia: Secondary | ICD-10-CM | POA: Diagnosis present

## 2018-01-06 DIAGNOSIS — E876 Hypokalemia: Secondary | ICD-10-CM | POA: Diagnosis not present

## 2018-01-06 DIAGNOSIS — S71131A Puncture wound without foreign body, right thigh, initial encounter: Secondary | ICD-10-CM | POA: Diagnosis present

## 2018-01-06 DIAGNOSIS — J45909 Unspecified asthma, uncomplicated: Secondary | ICD-10-CM | POA: Diagnosis present

## 2018-01-06 DIAGNOSIS — W3400XA Accidental discharge from unspecified firearms or gun, initial encounter: Secondary | ICD-10-CM

## 2018-01-06 DIAGNOSIS — J9690 Respiratory failure, unspecified, unspecified whether with hypoxia or hypercapnia: Secondary | ICD-10-CM

## 2018-01-06 DIAGNOSIS — D696 Thrombocytopenia, unspecified: Secondary | ICD-10-CM | POA: Diagnosis present

## 2018-01-06 DIAGNOSIS — R402362 Coma scale, best motor response, obeys commands, at arrival to emergency department: Secondary | ICD-10-CM | POA: Diagnosis present

## 2018-01-06 DIAGNOSIS — R402142 Coma scale, eyes open, spontaneous, at arrival to emergency department: Secondary | ICD-10-CM | POA: Diagnosis present

## 2018-01-06 DIAGNOSIS — S71132A Puncture wound without foreign body, left thigh, initial encounter: Principal | ICD-10-CM | POA: Diagnosis present

## 2018-01-06 DIAGNOSIS — R402252 Coma scale, best verbal response, oriented, at arrival to emergency department: Secondary | ICD-10-CM | POA: Diagnosis present

## 2018-01-06 DIAGNOSIS — Y9289 Other specified places as the place of occurrence of the external cause: Secondary | ICD-10-CM

## 2018-01-06 DIAGNOSIS — S3133XA Puncture wound without foreign body of scrotum and testes, initial encounter: Secondary | ICD-10-CM | POA: Diagnosis present

## 2018-01-06 DIAGNOSIS — S7011XA Contusion of right thigh, initial encounter: Secondary | ICD-10-CM | POA: Diagnosis present

## 2018-01-06 DIAGNOSIS — N5089 Other specified disorders of the male genital organs: Secondary | ICD-10-CM | POA: Diagnosis present

## 2018-01-06 DIAGNOSIS — T794XXA Traumatic shock, initial encounter: Secondary | ICD-10-CM | POA: Diagnosis present

## 2018-01-06 MED ORDER — SODIUM CHLORIDE 0.9 % IV SOLN
INTRAVENOUS | Status: AC | PRN
  Administered 2018-01-06: 125 mL/h via INTRAVENOUS
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
## 2018-01-06 NOTE — ED Notes (Signed)
Unit 1 [facility] start via [place]

## 2018-01-07 ENCOUNTER — Emergency Department (HOSPITAL_COMMUNITY): Payer: Medicaid Other

## 2018-01-07 ENCOUNTER — Emergency Department (HOSPITAL_COMMUNITY): Payer: Medicaid Other | Admitting: Anesthesiology

## 2018-01-07 ENCOUNTER — Encounter (HOSPITAL_COMMUNITY): Payer: Self-pay | Admitting: Emergency Medicine

## 2018-01-07 ENCOUNTER — Encounter (HOSPITAL_COMMUNITY): Admission: EM | Disposition: A | Discharge status: Home Health | Payer: Self-pay | Source: Home / Self Care

## 2018-01-07 ENCOUNTER — Other Ambulatory Visit (HOSPITAL_COMMUNITY): Payer: Self-pay

## 2018-01-07 ENCOUNTER — Inpatient Hospital Stay (HOSPITAL_COMMUNITY): Payer: Medicaid Other

## 2018-01-07 DIAGNOSIS — R402362 Coma scale, best motor response, obeys commands, at arrival to emergency department: Secondary | ICD-10-CM | POA: Diagnosis present

## 2018-01-07 DIAGNOSIS — J45909 Unspecified asthma, uncomplicated: Secondary | ICD-10-CM | POA: Diagnosis present

## 2018-01-07 DIAGNOSIS — S71132A Puncture wound without foreign body, left thigh, initial encounter: Secondary | ICD-10-CM | POA: Diagnosis present

## 2018-01-07 DIAGNOSIS — D696 Thrombocytopenia, unspecified: Secondary | ICD-10-CM | POA: Diagnosis present

## 2018-01-07 DIAGNOSIS — T794XXA Traumatic shock, initial encounter: Secondary | ICD-10-CM | POA: Diagnosis present

## 2018-01-07 DIAGNOSIS — Y9289 Other specified places as the place of occurrence of the external cause: Secondary | ICD-10-CM | POA: Diagnosis not present

## 2018-01-07 DIAGNOSIS — M79652 Pain in left thigh: Secondary | ICD-10-CM | POA: Diagnosis present

## 2018-01-07 DIAGNOSIS — S3133XA Puncture wound without foreign body of scrotum and testes, initial encounter: Secondary | ICD-10-CM | POA: Diagnosis present

## 2018-01-07 DIAGNOSIS — R402142 Coma scale, eyes open, spontaneous, at arrival to emergency department: Secondary | ICD-10-CM | POA: Diagnosis present

## 2018-01-07 DIAGNOSIS — S75192A Other specified injury of femoral vein at hip and thigh level, left leg, initial encounter: Secondary | ICD-10-CM

## 2018-01-07 DIAGNOSIS — D62 Acute posthemorrhagic anemia: Secondary | ICD-10-CM | POA: Diagnosis present

## 2018-01-07 DIAGNOSIS — S7011XA Contusion of right thigh, initial encounter: Secondary | ICD-10-CM | POA: Diagnosis present

## 2018-01-07 DIAGNOSIS — E876 Hypokalemia: Secondary | ICD-10-CM | POA: Diagnosis not present

## 2018-01-07 DIAGNOSIS — N5089 Other specified disorders of the male genital organs: Secondary | ICD-10-CM | POA: Diagnosis present

## 2018-01-07 DIAGNOSIS — R402252 Coma scale, best verbal response, oriented, at arrival to emergency department: Secondary | ICD-10-CM | POA: Diagnosis present

## 2018-01-07 DIAGNOSIS — M21372 Foot drop, left foot: Secondary | ICD-10-CM | POA: Diagnosis present

## 2018-01-07 DIAGNOSIS — S71131A Puncture wound without foreign body, right thigh, initial encounter: Secondary | ICD-10-CM | POA: Diagnosis present

## 2018-01-07 DIAGNOSIS — S75101A Unspecified injury of femoral vein at hip and thigh level, right leg, initial encounter: Secondary | ICD-10-CM | POA: Diagnosis present

## 2018-01-07 DIAGNOSIS — W3400XA Accidental discharge from unspecified firearms or gun, initial encounter: Secondary | ICD-10-CM | POA: Diagnosis not present

## 2018-01-07 DIAGNOSIS — R58 Hemorrhage, not elsewhere classified: Secondary | ICD-10-CM | POA: Diagnosis present

## 2018-01-07 HISTORY — PX: FEMORAL ARTERY EXPLORATION: SHX5160

## 2018-01-07 LAB — CBC
HCT: 22 % — ABNORMAL LOW (ref 39.0–52.0)
HEMATOCRIT: 25.8 % — AB (ref 39.0–52.0)
HEMATOCRIT: 29.6 % — AB (ref 39.0–52.0)
HEMATOCRIT: 32.3 % — AB (ref 39.0–52.0)
HEMATOCRIT: 34.9 % — AB (ref 39.0–52.0)
HEMOGLOBIN: 10.2 g/dL — AB (ref 13.0–17.0)
HEMOGLOBIN: 9.2 g/dL — AB (ref 13.0–17.0)
Hemoglobin: 11.8 g/dL — ABNORMAL LOW (ref 13.0–17.0)
Hemoglobin: 7.6 g/dL — ABNORMAL LOW (ref 13.0–17.0)
Hemoglobin: 8.8 g/dL — ABNORMAL LOW (ref 13.0–17.0)
MCH: 28.9 pg (ref 26.0–34.0)
MCH: 29.1 pg (ref 26.0–34.0)
MCH: 29.2 pg (ref 26.0–34.0)
MCH: 29.2 pg (ref 26.0–34.0)
MCH: 29.3 pg (ref 26.0–34.0)
MCHC: 28.5 g/dL — AB (ref 30.0–36.0)
MCHC: 33.8 g/dL (ref 30.0–36.0)
MCHC: 34.1 g/dL (ref 30.0–36.0)
MCHC: 34.5 g/dL (ref 30.0–36.0)
MCHC: 34.5 g/dL (ref 30.0–36.0)
MCV: 101.6 fL — AB (ref 78.0–100.0)
MCV: 84.3 fL (ref 78.0–100.0)
MCV: 84.8 fL (ref 78.0–100.0)
MCV: 85.7 fL (ref 78.0–100.0)
MCV: 86.6 fL (ref 78.0–100.0)
PLATELETS: 76 10*3/uL — AB (ref 150–400)
PLATELETS: 49 10*3/uL — AB (ref 150–400)
Platelets: 169 10*3/uL (ref 150–400)
Platelets: 49 10*3/uL — ABNORMAL LOW (ref 150–400)
Platelets: 69 10*3/uL — ABNORMAL LOW (ref 150–400)
RBC: 2.61 MIL/uL — AB (ref 4.22–5.81)
RBC: 3.01 MIL/uL — ABNORMAL LOW (ref 4.22–5.81)
RBC: 3.18 MIL/uL — ABNORMAL LOW (ref 4.22–5.81)
RBC: 3.49 MIL/uL — ABNORMAL LOW (ref 4.22–5.81)
RBC: 4.03 MIL/uL — ABNORMAL LOW (ref 4.22–5.81)
RDW: 13 % (ref 11.5–15.5)
RDW: 14.4 % (ref 11.5–15.5)
RDW: 14.5 % (ref 11.5–15.5)
RDW: 14.6 % (ref 11.5–15.5)
RDW: 14.6 % (ref 11.5–15.5)
WBC: 5.6 10*3/uL (ref 4.0–10.5)
WBC: 6.5 10*3/uL (ref 4.0–10.5)
WBC: 6.9 10*3/uL (ref 4.0–10.5)
WBC: 7 10*3/uL (ref 4.0–10.5)
WBC: 7.7 10*3/uL (ref 4.0–10.5)

## 2018-01-07 LAB — POCT I-STAT 7, (LYTES, BLD GAS, ICA,H+H)
ACID-BASE DEFICIT: 8 mmol/L — AB (ref 0.0–2.0)
Acid-base deficit: 9 mmol/L — ABNORMAL HIGH (ref 0.0–2.0)
BICARBONATE: 19.3 mmol/L — AB (ref 20.0–28.0)
Bicarbonate: 19.9 mmol/L — ABNORMAL LOW (ref 20.0–28.0)
CALCIUM ION: 0.67 mmol/L — AB (ref 1.15–1.40)
Calcium, Ion: 0.84 mmol/L — CL (ref 1.15–1.40)
HCT: 28 % — ABNORMAL LOW (ref 39.0–52.0)
HCT: 35 % — ABNORMAL LOW (ref 39.0–52.0)
HEMOGLOBIN: 11.9 g/dL — AB (ref 13.0–17.0)
HEMOGLOBIN: 9.5 g/dL — AB (ref 13.0–17.0)
O2 SAT: 89 %
O2 Saturation: 95 %
PCO2 ART: 42.1 mmHg (ref 32.0–48.0)
PCO2 ART: 50.5 mmHg — AB (ref 32.0–48.0)
PH ART: 7.188 — AB (ref 7.350–7.450)
PH ART: 7.257 — AB (ref 7.350–7.450)
PO2 ART: 61 mmHg — AB (ref 83.0–108.0)
PO2 ART: 76 mmHg — AB (ref 83.0–108.0)
POTASSIUM: 4.3 mmol/L (ref 3.5–5.1)
Potassium: 3.8 mmol/L (ref 3.5–5.1)
Sodium: 147 mmol/L — ABNORMAL HIGH (ref 135–145)
Sodium: 147 mmol/L — ABNORMAL HIGH (ref 135–145)
TCO2: 21 mmol/L — ABNORMAL LOW (ref 22–32)
TCO2: 22 mmol/L (ref 22–32)

## 2018-01-07 LAB — COMPREHENSIVE METABOLIC PANEL
ALBUMIN: 3.3 g/dL — AB (ref 3.5–5.0)
ALT: 25 U/L (ref 17–63)
AST: 43 U/L — AB (ref 15–41)
Alkaline Phosphatase: 72 U/L (ref 38–126)
Anion gap: 28 — ABNORMAL HIGH (ref 5–15)
BUN: 9 mg/dL (ref 6–20)
CHLORIDE: 113 mmol/L — AB (ref 101–111)
CO2: 12 mmol/L — ABNORMAL LOW (ref 22–32)
Calcium: 9.6 mg/dL (ref 8.9–10.3)
Creatinine, Ser: 1.52 mg/dL — ABNORMAL HIGH (ref 0.61–1.24)
GFR calc Af Amer: 31 mL/min — ABNORMAL LOW (ref >60)
GFR calc non Af Amer: 26 mL/min — ABNORMAL LOW (ref >60)
GLUCOSE: 166 mg/dL — AB (ref 65–99)
POTASSIUM: 4.4 mmol/L (ref 3.5–5.1)
SODIUM: 153 mmol/L — AB (ref 135–145)
Total Bilirubin: 0.7 mg/dL (ref 0.3–1.2)
Total Protein: 5.2 g/dL — ABNORMAL LOW (ref 6.5–8.1)

## 2018-01-07 LAB — I-STAT ARTERIAL BLOOD GAS, ED
Acid-base deficit: 12 mmol/L — ABNORMAL HIGH (ref 0.0–2.0)
BICARBONATE: 20.1 mmol/L (ref 20.0–28.0)
O2 SAT: 81 %
PCO2 ART: 72.1 mmHg — AB (ref 32.0–48.0)
PH ART: 7.047 — AB (ref 7.350–7.450)
Patient temperature: 96.9
TCO2: 22 mmol/L (ref 22–32)
pO2, Arterial: 62 mmHg — ABNORMAL LOW (ref 83.0–108.0)

## 2018-01-07 LAB — I-STAT CHEM 8, ED
BUN: 8 mg/dL (ref 6–20)
CHLORIDE: 112 mmol/L — AB (ref 101–111)
CREATININE: 1.3 mg/dL — AB (ref 0.61–1.24)
Calcium, Ion: 1.13 mmol/L — ABNORMAL LOW (ref 1.15–1.40)
Glucose, Bld: 157 mg/dL — ABNORMAL HIGH (ref 65–99)
HEMATOCRIT: 31 % — AB (ref 39.0–52.0)
Hemoglobin: 10.5 g/dL — ABNORMAL LOW (ref 13.0–17.0)
POTASSIUM: 4.1 mmol/L (ref 3.5–5.1)
SODIUM: 150 mmol/L — AB (ref 135–145)
TCO2: 14 mmol/L — ABNORMAL LOW (ref 22–32)

## 2018-01-07 LAB — PROTIME-INR
INR: 1.32
INR: 1.38
INR: 1.8
PROTHROMBIN TIME: 20.7 s — AB (ref 11.4–15.2)
Prothrombin Time: 16.8 seconds — ABNORMAL HIGH (ref 11.4–15.2)
Prothrombin Time: 16.2 seconds — ABNORMAL HIGH (ref 11.4–15.2)

## 2018-01-07 LAB — DIC (DISSEMINATED INTRAVASCULAR COAGULATION) PANEL
APTT: 40 s — AB (ref 24–36)
FIBRINOGEN: 239 mg/dL (ref 210–475)
FIBRINOGEN: 233 mg/dL (ref 210–475)
INR: 1.25
PLATELETS: 107 10*3/uL — AB (ref 150–400)
PROTHROMBIN TIME: 15.6 s — AB (ref 11.4–15.2)
SMEAR REVIEW: NONE SEEN

## 2018-01-07 LAB — POCT I-STAT 3, ART BLOOD GAS (G3+)
ACID-BASE DEFICIT: 3 mmol/L — AB (ref 0.0–2.0)
Bicarbonate: 23 mmol/L (ref 20.0–28.0)
O2 Saturation: 100 %
PH ART: 7.312 — AB (ref 7.350–7.450)
PO2 ART: 210 mmHg — AB (ref 83.0–108.0)
TCO2: 24 mmol/L (ref 22–32)
pCO2 arterial: 45.9 mmHg (ref 32.0–48.0)

## 2018-01-07 LAB — URINALYSIS, ROUTINE W REFLEX MICROSCOPIC
BACTERIA UA: NONE SEEN
Bilirubin Urine: NEGATIVE
Glucose, UA: >=500 mg/dL — AB
KETONES UR: NEGATIVE mg/dL
Leukocytes, UA: NEGATIVE
NITRITE: NEGATIVE
PH: 6 (ref 5.0–8.0)
Protein, ur: NEGATIVE mg/dL
SPECIFIC GRAVITY, URINE: 1.014 (ref 1.005–1.030)

## 2018-01-07 LAB — ABO/RH: ABO/RH(D): O POS

## 2018-01-07 LAB — BASIC METABOLIC PANEL
Anion gap: 10 (ref 5–15)
BUN: 7 mg/dL (ref 6–20)
CALCIUM: 7.2 mg/dL — AB (ref 8.9–10.3)
CO2: 21 mmol/L — AB (ref 22–32)
CREATININE: 0.87 mg/dL (ref 0.61–1.24)
Chloride: 111 mmol/L (ref 101–111)
GFR calc Af Amer: >60 mL/min (ref >60)
GLUCOSE: 108 mg/dL — AB (ref 65–99)
Potassium: 3.3 mmol/L — ABNORMAL LOW (ref 3.5–5.1)
Sodium: 142 mmol/L (ref 135–145)

## 2018-01-07 LAB — LACTIC ACID, PLASMA: Lactic Acid, Venous: 1.7 mmol/L (ref 0.5–1.9)

## 2018-01-07 LAB — I-STAT CG4 LACTIC ACID, ED
LACTIC ACID, VENOUS: 13 mmol/L — AB (ref 0.5–1.9)
LACTIC ACID, VENOUS: 7.6 mmol/L — AB (ref 0.5–1.9)
Lactic Acid, Venous: >17 mmol/L (ref 0.5–1.9)

## 2018-01-07 LAB — TRIGLYCERIDES: TRIGLYCERIDES: 71 mg/dL (ref <150)

## 2018-01-07 LAB — HIV ANTIBODY (ROUTINE TESTING W REFLEX): HIV Screen 4th Generation wRfx: NONREACTIVE

## 2018-01-07 LAB — HEMOGLOBIN AND HEMATOCRIT, BLOOD
HEMATOCRIT: 37.1 % — AB (ref 39.0–52.0)
HEMOGLOBIN: 11.8 g/dL — AB (ref 13.0–17.0)

## 2018-01-07 LAB — PREPARE RBC (CROSSMATCH)

## 2018-01-07 LAB — DIC (DISSEMINATED INTRAVASCULAR COAGULATION)PANEL
INR: 1.4
Platelets: 77 10*3/uL — ABNORMAL LOW (ref 150–400)
Prothrombin Time: 17.1 seconds — ABNORMAL HIGH (ref 11.4–15.2)
Smear Review: NONE SEEN
aPTT: 41 seconds — ABNORMAL HIGH (ref 24–36)

## 2018-01-07 LAB — BLOOD PRODUCT ORDER (VERBAL) VERIFICATION

## 2018-01-07 LAB — ETHANOL: Alcohol, Ethyl (B): <10 mg/dL (ref <10)

## 2018-01-07 LAB — MASSIVE TRANSFUSION PROTOCOL ORDER (BLOOD BANK NOTIFICATION)

## 2018-01-07 LAB — MRSA PCR SCREENING: MRSA by PCR: NEGATIVE

## 2018-01-07 SURGERY — EXPLORATION, ARTERY, FEMORAL
Anesthesia: General | Laterality: Left

## 2018-01-07 MED ORDER — ROCURONIUM BROMIDE 100 MG/10ML IV SOLN
INTRAVENOUS | Status: DC | PRN
  Administered 2018-01-07: 100 mg via INTRAVENOUS
  Administered 2018-01-07 (×3): 50 mg via INTRAVENOUS

## 2018-01-07 MED ORDER — PROPOFOL 500 MG/50ML IV EMUL
INTRAVENOUS | Status: DC | PRN
  Administered 2018-01-07: 40 ug/kg/min via INTRAVENOUS

## 2018-01-07 MED ORDER — VECURONIUM BROMIDE 10 MG IV SOLR
INTRAVENOUS | Status: AC
  Filled 2018-01-07: qty 10

## 2018-01-07 MED ORDER — ETOMIDATE 2 MG/ML IV SOLN
INTRAVENOUS | Status: AC | PRN
  Administered 2018-01-07: 20 mg via INTRAVENOUS

## 2018-01-07 MED ORDER — METHYLPREDNISOLONE SODIUM SUCC 125 MG IJ SOLR
125.0000 mg | Freq: Once | INTRAMUSCULAR | Status: AC
  Administered 2018-01-07: 125 mg via INTRAVENOUS

## 2018-01-07 MED ORDER — IPRATROPIUM-ALBUTEROL 0.5-2.5 (3) MG/3ML IN SOLN
3.0000 mL | Freq: Four times a day (QID) | RESPIRATORY_TRACT | Status: DC
  Administered 2018-01-07 – 2018-01-10 (×12): 3 mL via RESPIRATORY_TRACT
  Filled 2018-01-07 (×14): qty 3

## 2018-01-07 MED ORDER — ALBUMIN HUMAN 5 % IV SOLN
INTRAVENOUS | Status: AC
  Administered 2018-01-07: 25 g via INTRAVENOUS
  Filled 2018-01-07: qty 500

## 2018-01-07 MED ORDER — DIPHENHYDRAMINE HCL 50 MG/ML IJ SOLN
50.0000 mg | Freq: Once | INTRAMUSCULAR | Status: AC
  Administered 2018-01-07: 50 mg via INTRAVENOUS
  Filled 2018-01-07: qty 1

## 2018-01-07 MED ORDER — CALCIUM GLUCONATE 10 % IV SOLN: INTRAVENOUS | Status: DC | PRN

## 2018-01-07 MED ORDER — SODIUM CHLORIDE 0.9 % IV SOLN
INTRAVENOUS | Status: DC | PRN
  Administered 2018-01-07 (×2): via INTRAVENOUS

## 2018-01-07 MED ORDER — IPRATROPIUM-ALBUTEROL 0.5-2.5 (3) MG/3ML IN SOLN
RESPIRATORY_TRACT | Status: AC
  Filled 2018-01-07: qty 6

## 2018-01-07 MED ORDER — SODIUM CHLORIDE 0.9 % IV SOLN
1000.0000 mg | Freq: Once | INTRAVENOUS | Status: AC
  Administered 2018-01-07: 1000 mg via INTRAVENOUS
  Filled 2018-01-07: qty 1100

## 2018-01-07 MED ORDER — SUCCINYLCHOLINE CHLORIDE 20 MG/ML IJ SOLN
INTRAMUSCULAR | Status: AC | PRN
  Administered 2018-01-07: 100 mg via INTRAVENOUS

## 2018-01-07 MED ORDER — FENTANYL CITRATE (PF) 100 MCG/2ML IJ SOLN: 50.0000 ug | Freq: Once | INTRAMUSCULAR | Status: DC

## 2018-01-07 MED ORDER — VECURONIUM BROMIDE 10 MG IV SOLR
10.0000 mg | Freq: Once | INTRAVENOUS | Status: AC
  Administered 2018-01-07: 10 mg via INTRAVENOUS

## 2018-01-07 MED ORDER — FENTANYL BOLUS VIA INFUSION
50.0000 ug | INTRAVENOUS | Status: DC | PRN
  Filled 2018-01-07: qty 50

## 2018-01-07 MED ORDER — LACTATED RINGERS IV SOLN
INTRAVENOUS | Status: DC | PRN
  Administered 2018-01-07 (×2): via INTRAVENOUS

## 2018-01-07 MED ORDER — CALCIUM CHLORIDE 10 % IV SOLN
INTRAVENOUS | Status: DC | PRN
  Administered 2018-01-07 (×2): 1000 mg via INTRAVENOUS

## 2018-01-07 MED ORDER — PROPOFOL 1000 MG/100ML IV EMUL
INTRAVENOUS | Status: AC
  Administered 2018-01-07: 02:00:00
  Filled 2018-01-07: qty 100

## 2018-01-07 MED ORDER — FENTANYL CITRATE (PF) 250 MCG/5ML IJ SOLN
INTRAMUSCULAR | Status: DC | PRN
  Administered 2018-01-07 (×6): 50 ug via INTRAVENOUS
  Administered 2018-01-07 (×2): 100 ug via INTRAVENOUS

## 2018-01-07 MED ORDER — SODIUM CHLORIDE 0.9 % IV SOLN
INTRAVENOUS | Status: DC
  Administered 2018-01-07 – 2018-01-08 (×4): via INTRAVENOUS

## 2018-01-07 MED ORDER — FAMOTIDINE IN NACL 20-0.9 MG/50ML-% IV SOLN
20.0000 mg | Freq: Once | INTRAVENOUS | Status: AC
  Administered 2018-01-07: 20 mg via INTRAVENOUS

## 2018-01-07 MED ORDER — METHYLPREDNISOLONE SODIUM SUCC 125 MG IJ SOLR
INTRAMUSCULAR | Status: AC
  Administered 2018-01-07: 125 mg via INTRAVENOUS
  Filled 2018-01-07: qty 2

## 2018-01-07 MED ORDER — FENTANYL CITRATE (PF) 100 MCG/2ML IJ SOLN
INTRAMUSCULAR | Status: AC | PRN
  Administered 2018-01-07: 50 ug via INTRAVENOUS

## 2018-01-07 MED ORDER — FENTANYL CITRATE (PF) 250 MCG/5ML IJ SOLN
INTRAMUSCULAR | Status: AC
  Filled 2018-01-07: qty 5

## 2018-01-07 MED ORDER — 0.9 % SODIUM CHLORIDE (POUR BTL) OPTIME
TOPICAL | Status: DC | PRN
  Administered 2018-01-07: 1000 mL

## 2018-01-07 MED ORDER — CEFAZOLIN SODIUM-DEXTROSE 2-3 GM-%(50ML) IV SOLR
INTRAVENOUS | Status: DC | PRN
  Administered 2018-01-07: 2 g via INTRAVENOUS

## 2018-01-07 MED ORDER — ORAL CARE MOUTH RINSE
15.0000 mL | OROMUCOSAL | Status: DC
  Administered 2018-01-07 – 2018-01-09 (×19): 15 mL via OROMUCOSAL

## 2018-01-07 MED ORDER — HEPARIN SODIUM (PORCINE) 5000 UNIT/ML IJ SOLN
INTRAMUSCULAR | Status: DC | PRN
  Administered 2018-01-07: 500 mL

## 2018-01-07 MED ORDER — ALBUMIN HUMAN 5 % IV SOLN
25.0000 g | Freq: Once | INTRAVENOUS | Status: AC
  Administered 2018-01-07: 25 g via INTRAVENOUS

## 2018-01-07 MED ORDER — FENTANYL 2500MCG IN NS 250ML (10MCG/ML) PREMIX INFUSION
25.0000 ug/h | INTRAVENOUS | Status: DC
  Administered 2018-01-07: 100 ug/h via INTRAVENOUS
  Administered 2018-01-07: 150 ug/h via INTRAVENOUS
  Administered 2018-01-08: 100 ug/h via INTRAVENOUS
  Filled 2018-01-07 (×3): qty 250

## 2018-01-07 MED ORDER — PROPOFOL 1000 MG/100ML IV EMUL
0.0000 ug/kg/min | INTRAVENOUS | Status: DC
  Administered 2018-01-07: 40 ug/kg/min via INTRAVENOUS
  Administered 2018-01-07: 20 ug/kg/min via INTRAVENOUS
  Administered 2018-01-08: 15 ug/kg/min via INTRAVENOUS
  Filled 2018-01-07 (×2): qty 100

## 2018-01-07 MED ORDER — SODIUM CHLORIDE 0.9 % IV SOLN
INTRAVENOUS | Status: AC
  Filled 2018-01-07: qty 1.2

## 2018-01-07 MED ORDER — ALBUMIN HUMAN 5 % IV SOLN
25.0000 g | Freq: Once | INTRAVENOUS | Status: AC
  Administered 2018-01-07: 25 g via INTRAVENOUS
  Filled 2018-01-07: qty 500

## 2018-01-07 MED ORDER — FENTANYL CITRATE (PF) 100 MCG/2ML IJ SOLN
INTRAMUSCULAR | Status: AC
  Administered 2018-01-07: 50 ug
  Filled 2018-01-07: qty 2

## 2018-01-07 MED ORDER — TRANEXAMIC ACID 1000 MG/10ML IV SOLN
1000.0000 mg | Freq: Once | INTRAVENOUS | Status: AC
  Administered 2018-01-07 (×2): 1000 mg via INTRAVENOUS
  Filled 2018-01-07: qty 10

## 2018-01-07 MED ORDER — ROCURONIUM BROMIDE 50 MG/5ML IV SOLN
INTRAVENOUS | Status: AC | PRN
  Administered 2018-01-07: 50 mg via INTRAVENOUS

## 2018-01-07 MED ORDER — CALCIUM CHLORIDE 10 % IV SOLN
INTRAVENOUS | Status: AC
  Filled 2018-01-07: qty 20

## 2018-01-07 MED ORDER — CHLORHEXIDINE GLUCONATE 0.12% ORAL RINSE (MEDLINE KIT)
15.0000 mL | Freq: Two times a day (BID) | OROMUCOSAL | Status: DC
  Administered 2018-01-07 – 2018-01-08 (×4): 15 mL via OROMUCOSAL

## 2018-01-07 MED ORDER — IOPAMIDOL (ISOVUE-370) INJECTION 76%
100.0000 mL | Freq: Once | INTRAVENOUS | Status: AC | PRN
  Administered 2018-01-07: 100 mL via INTRAVENOUS

## 2018-01-07 SURGICAL SUPPLY — 43 items
ADH SKN CLS APL DERMABOND .7 (GAUZE/BANDAGES/DRESSINGS) ×1
BANDAGE ACE 4X5 VEL STRL LF (GAUZE/BANDAGES/DRESSINGS) IMPLANT
CANISTER SUCT 3000ML PPV (MISCELLANEOUS) ×3 IMPLANT
CLIP VESOCCLUDE MED 24/CT (CLIP) ×3 IMPLANT
CLIP VESOCCLUDE SM WIDE 24/CT (CLIP) ×3 IMPLANT
DERMABOND ADVANCED (GAUZE/BANDAGES/DRESSINGS) ×2
DERMABOND ADVANCED .7 DNX12 (GAUZE/BANDAGES/DRESSINGS) ×1 IMPLANT
DRAIN CHANNEL 15F RND FF W/TCR (WOUND CARE) IMPLANT
DRAPE X-RAY CASS 24X20 (DRAPES) IMPLANT
ELECT REM PT RETURN 9FT ADLT (ELECTROSURGICAL) ×3
ELECTRODE REM PT RTRN 9FT ADLT (ELECTROSURGICAL) ×1 IMPLANT
EVACUATOR SILICONE 100CC (DRAIN) IMPLANT
GAUZE SPONGE 4X4 12PLY STRL LF (GAUZE/BANDAGES/DRESSINGS) ×4 IMPLANT
GLOVE BIO SURGEON STRL SZ7.5 (GLOVE) ×3 IMPLANT
GOWN STRL REUS W/ TWL LRG LVL3 (GOWN DISPOSABLE) ×2 IMPLANT
GOWN STRL REUS W/ TWL XL LVL3 (GOWN DISPOSABLE) ×1 IMPLANT
GOWN STRL REUS W/TWL LRG LVL3 (GOWN DISPOSABLE) ×6
GOWN STRL REUS W/TWL XL LVL3 (GOWN DISPOSABLE) ×3
HEMOSTAT SNOW SURGICEL 2X4 (HEMOSTASIS) IMPLANT
KIT BASIN OR (CUSTOM PROCEDURE TRAY) ×3 IMPLANT
KIT TURNOVER KIT B (KITS) ×3 IMPLANT
MARKER GRAFT CORONARY BYPASS (MISCELLANEOUS) IMPLANT
NS IRRIG 1000ML POUR BTL (IV SOLUTION) ×6 IMPLANT
PACK PERIPHERAL VASCULAR (CUSTOM PROCEDURE TRAY) ×3 IMPLANT
PAD ARMBOARD 7.5X6 YLW CONV (MISCELLANEOUS) ×6 IMPLANT
SET COLLECT BLD 21X3/4 12 (NEEDLE) IMPLANT
STOPCOCK 4 WAY LG BORE MALE ST (IV SETS) IMPLANT
SUT ETHILON 3 0 PS 1 (SUTURE) IMPLANT
SUT PROLENE 5 0 C 1 24 (SUTURE) ×3 IMPLANT
SUT PROLENE 6 0 BV (SUTURE) ×3 IMPLANT
SUT SILK 3 0 (SUTURE)
SUT SILK 3-0 18XBRD TIE 12 (SUTURE) IMPLANT
SUT VIC AB 2-0 CT1 27 (SUTURE) ×6
SUT VIC AB 2-0 CT1 TAPERPNT 27 (SUTURE) ×2 IMPLANT
SUT VIC AB 3-0 SH 27 (SUTURE) ×6
SUT VIC AB 3-0 SH 27X BRD (SUTURE) ×2 IMPLANT
SUT VICRYL 4-0 PS2 18IN ABS (SUTURE) ×6 IMPLANT
TAPE CLOTH SURG 4X10 WHT LF (GAUZE/BANDAGES/DRESSINGS) ×4 IMPLANT
TOWEL GREEN STERILE (TOWEL DISPOSABLE) ×3 IMPLANT
TRAY FOLEY MTR SLVR 16FR STAT (SET/KITS/TRAYS/PACK) IMPLANT
TUBING EXTENTION W/L.L. (IV SETS) IMPLANT
UNDERPAD 30X30 (UNDERPADS AND DIAPERS) ×3 IMPLANT
WATER STERILE IRR 1000ML POUR (IV SOLUTION) ×3 IMPLANT
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
## 2018-01-07 NOTE — ED Notes (Signed)
Extensive swelling noted to neck
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
## 2018-01-07 NOTE — Anesthesia Preprocedure Evaluation (Signed)
Anesthesia Evaluation  Patient identified by MRN, date of birth, ID band  Reviewed: Unable to perform ROS - Chart review onlyPreop documentation limited or incomplete due to emergent nature of procedure.  Airway Mallampati: Intubated       Dental   Pulmonary           Cardiovascular      Neuro/Psych    GI/Hepatic   Endo/Other    Renal/GU      Musculoskeletal   Abdominal   Peds  Hematology   Anesthesia Other Findings   Reproductive/Obstetrics                             Anesthesia Physical Anesthesia Plan  ASA: IV and emergent  Anesthesia Plan: General   Post-op Pain Management:    Induction:   PONV Risk Score and Plan:   Airway Management Planned: Oral ETT  Additional Equipment:   Intra-op Plan:   Post-operative Plan:   Informed Consent: I have reviewed the patients History and Physical, chart, labs and discussed the procedure including the risks, benefits and alternatives for the proposed anesthesia with the patient or authorized representative who has indicated his/her understanding and acceptance.   History available from chart only and Only emergency history available  Plan Discussed with: CRNA, Anesthesiologist and Surgeon  Anesthesia Plan Comments:         Anesthesia Quick Evaluation

## 2018-01-07 NOTE — Anesthesia Procedure Notes (Signed)
Central Venous Catheter Insertion Performed by: anesthesiologist Start/End5/31/2019 3:14 AM, 01/07/2018 3:19 AM Patient location: OR. Preanesthetic checklist: patient identified, IV checked, site marked, risks and benefits discussed, surgical consent, monitors and equipment checked, pre-op evaluation, timeout performed and anesthesia consent Position: supine Hand hygiene performed  and maximum sterile barriers used  Catheter size: 9 Fr Total catheter length 10. MAC introducer Procedure performed without using ultrasound guided technique. Attempts: 1 Following insertion, line sutured and dressing applied. Post procedure assessment: blood return through all ports, free fluid flow and no air  Patient tolerated the procedure well with no immediate complications. Additional procedure comments: This was a line exchange over wire through a preexisting CVL placed in the ED earlier. Replaced due to port malfunction. Marland Kitchen
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
## 2018-01-07 NOTE — ED Notes (Signed)
Evidence given to Nonnie DoneLorna Justice, Administrator, artsCrime Scene photographer
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
## 2018-01-07 NOTE — ED Notes (Signed)
Pt returned from CT scan and BP reading low, checked wounds under blankets and an est 1000cc blood loss between legs; Tourniquets reapplied to bilat legs at this time by Janee Morn, MD and [employer]

## 2018-01-07 NOTE — ED Notes (Signed)
Dad - [phone]

## 2018-01-07 NOTE — Progress Notes (Signed)
Blood oozing from around all 4 wound vac sites. Paged Dr. Edilia Bo, who is now at bedside to evaluate. Will leave wound vacs in place and verbal for wound care RN to change out dressings tomorrow.
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
## 2018-01-07 NOTE — Progress Notes (Signed)
Pt reaching for tube, unable to be verbally redirected by staff. Verbal order from Dr. Lindie Spruce for bilateral soft wrist restraints.

## 2018-01-07 NOTE — ED Notes (Signed)
Pt becoming very anxious stating he cannot breathe, Thompson,MD at the bedside recommending we intubate; copious amounts of pink frothy sputum noted with Yankauer
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
## 2018-01-07 NOTE — Progress Notes (Signed)
Lower extremity wound vac leaking. Paged Dr. Edilia Bo, verbal order to change out wound vac dressing.

## 2018-01-07 NOTE — ED Notes (Signed)
Parents at bedside

## 2018-01-07 NOTE — Progress Notes (Signed)
BP 89/59. Paged Dr. Lindie Spruce. Verbal order for albumin. See associated orders.

## 2018-01-07 NOTE — Progress Notes (Signed)
RT NOTE:  MD attempted intubation with Glidescope x2 without success due to copious pulmonary edema and blood. ED bronchoscope used with ETT to attempt intubation without success. MD attempted with Glidescope again and ETT was placed, ETCO2 positive color change, XRAY placement confirmed.

## 2018-01-07 NOTE — Progress Notes (Signed)
BP 86/39. Paged Dr. Lindie SpruceWyatt, verbal order for 500cc albumin. Will continue to closely monitor pt.
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
## 2018-01-07 NOTE — Transfer of Care (Signed)
Immediate Anesthesia Transfer of Care Note  Patient: Antonio Torres  Procedure(s) Performed: EXPLORATION LEFT THIGH (Left )  Patient Location: ICU  Anesthesia Type:General  Level of Consciousness: sedated and Patient remains intubated per anesthesia plan  Airway & Oxygen Therapy: Patient remains intubated per anesthesia plan and Patient placed on Ventilator (see vital sign flow sheet for setting)  Post-op Assessment: Report given to RN and Post -op Vital signs reviewed and stable  Post vital signs: Reviewed and stable  Last Vitals:  Vitals Value Taken Time  BP    Temp    Pulse    Resp    SpO2      Last Pain:  Vitals:   01/07/18 0137  TempSrc:   PainSc: 10-Worst pain ever         Complications: No apparent anesthesia complications

## 2018-01-07 NOTE — Procedures (Addendum)
Central Venous Catheter Insertion Procedure Note Late entry Antonio Torres [id number] September 04, 1995  Procedure: Insertion of Central Venous Catheter Indications: access for blood product resuscitation  Procedure Details Consent: emergency Time Out: Verified patient identification, verified procedure, site/side was marked, verified correct patient position, special equipment/implants available, medications/allergies/relevent history reviewed, required imaging and test results available.  Performed  Maximum sterile technique was used including antiseptics, cap, gloves, gown, hand hygiene, mask and sheet. Skin prep: Chlorhexidine; local anesthetic administered A antimicrobial bonded/coated 64fr triple lumen catheter was placed in the left subclavian vein using the Seldinger technique.  Evaluation Blood flow good Complications: No apparent complications Patient did tolerate procedure well. Chest X-ray ordered to verify placement.  CXR: pending.  Liz Malady 01/07/2018, 6:21 AM
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
## 2018-01-07 NOTE — ED Notes (Signed)
To CT
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
## 2018-01-07 NOTE — Anesthesia Procedure Notes (Signed)
Arterial Line Insertion Start/End5/31/2019 3:30 AM, 01/07/2018 3:45 AM Performed by: Claudina Lick, CRNA, CRNA  Patient location: OR. Preanesthetic checklist: patient identified, IV checked, site marked, surgical consent, monitors and equipment checked and pre-op evaluation radial was placed Catheter size: 20 G Hand hygiene performed  and maximum sterile barriers used   Attempts: 1 Procedure performed without using ultrasound guided technique. Ultrasound Notes:anatomy identified, needle tip was noted to be adjacent to the nerve/plexus identified and no ultrasound evidence of intravascular and/or intraneural injection Following insertion, dressing applied and Biopatch. Patient tolerated the procedure well with no immediate complications.
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
## 2018-01-07 NOTE — ED Notes (Signed)
Back from CT

## 2018-01-07 NOTE — Progress Notes (Signed)
RT NOTE:  Critical ABG results reported to Dr. Janee Morn @ bedside. Changes VT: 580, RR 18, PEEP 15.  Pt transporting to OR on our VENT. Pt switched to OR vent.

## 2018-01-07 NOTE — ED Notes (Signed)
OR ready at this time.

## 2018-01-07 NOTE — Progress Notes (Signed)
Patient is sedated with propofol and fentanyl.  Not responding.  Three NPWDs in place.  All draining blood, but not full.  BP soft by arterial line, b ut okay by cuff.  UO good.Oxygen saturations are good currently.  Will check CXR and start to wean back on Ventilator setting, especially PEEP and FIO2.  Will recheck ABG later this AM.  I think he had an allergic reaction to something he was given leading to the abrupt facial and neck swelling and the possible coagulopathy.  [name], [name], MD, FACS [phone] Trauma Surgeon

## 2018-01-07 NOTE — Progress Notes (Signed)
CBC @ 1519 resulted, hgb 7.6. Paged Dr. Lindie SpruceWyatt, awaiting call back.

## 2018-01-08 LAB — BPAM FFP
BLOOD PRODUCT EXPIRATION DATE: 201906052359
BLOOD PRODUCT EXPIRATION DATE: 201906052359
BLOOD PRODUCT EXPIRATION DATE: 201906052359
BLOOD PRODUCT EXPIRATION DATE: 201906052359
BLOOD PRODUCT EXPIRATION DATE: 201906052359
BLOOD PRODUCT EXPIRATION DATE: 201906052359
BLOOD PRODUCT EXPIRATION DATE: 201906052359
BLOOD PRODUCT EXPIRATION DATE: 201906052359
BLOOD PRODUCT EXPIRATION DATE: 201906142359
BLOOD PRODUCT EXPIRATION DATE: 201906212359
Blood Product Expiration Date: 201906022359
Blood Product Expiration Date: 201906022359
Blood Product Expiration Date: 201906032359
Blood Product Expiration Date: 201906052359
Blood Product Expiration Date: 201906052359
Blood Product Expiration Date: 201906052359
Blood Product Expiration Date: 201906052359
Blood Product Expiration Date: 201906052359
Blood Product Expiration Date: 201906052359
Blood Product Expiration Date: 201906052359
Blood Product Expiration Date: 201906052359
Blood Product Expiration Date: 201906052359
Blood Product Expiration Date: 201906052359
Blood Product Expiration Date: 201906052359
Blood Product Expiration Date: 201906052359
Blood Product Expiration Date: 201906052359
Blood Product Expiration Date: 201906062359
Blood Product Expiration Date: 201906072359
Blood Product Expiration Date: 201906142359
Blood Product Expiration Date: 201906212359
ISSUE DATE / TIME: 201905302329
ISSUE DATE / TIME: 201905310037
ISSUE DATE / TIME: 201905310056
ISSUE DATE / TIME: 201905310056
ISSUE DATE / TIME: 201905310056
ISSUE DATE / TIME: 201905310056
ISSUE DATE / TIME: 201905310130
ISSUE DATE / TIME: 201905310130
ISSUE DATE / TIME: 201905310130
ISSUE DATE / TIME: 201905310141
ISSUE DATE / TIME: 201905310215
ISSUE DATE / TIME: 201905310215
ISSUE DATE / TIME: 201905310215
ISSUE DATE / TIME: 201905310338
ISSUE DATE / TIME: 201905310338
ISSUE DATE / TIME: 201905310338
ISSUE DATE / TIME: 201905310407
ISSUE DATE / TIME: 201905310430
ISSUE DATE / TIME: 201905310430
ISSUE DATE / TIME: 201905302329
ISSUE DATE / TIME: 201905310037
ISSUE DATE / TIME: 201905310130
ISSUE DATE / TIME: 201905310215
ISSUE DATE / TIME: 201905310221
ISSUE DATE / TIME: 201905310221
ISSUE DATE / TIME: 201905310338
ISSUE DATE / TIME: 201905310407
ISSUE DATE / TIME: 201905310430
ISSUE DATE / TIME: 201905310430
ISSUE DATE / TIME: 201905312158
UNIT TYPE AND RH: 5100
UNIT TYPE AND RH: 5100
UNIT TYPE AND RH: 5100
UNIT TYPE AND RH: 5100
UNIT TYPE AND RH: 5100
UNIT TYPE AND RH: 5100
UNIT TYPE AND RH: 6200
UNIT TYPE AND RH: 6200
UNIT TYPE AND RH: 6200
UNIT TYPE AND RH: 6200
UNIT TYPE AND RH: 6200
UNIT TYPE AND RH: 6200
UNIT TYPE AND RH: 6200
UNIT TYPE AND RH: 6200
Unit Type and Rh: 5100
Unit Type and Rh: 5100
Unit Type and Rh: 5100
Unit Type and Rh: 5100
Unit Type and Rh: 600
Unit Type and Rh: 600
Unit Type and Rh: 6200
Unit Type and Rh: 6200
Unit Type and Rh: 6200
Unit Type and Rh: 6200
Unit Type and Rh: 6200
Unit Type and Rh: 6200
Unit Type and Rh: 6200
Unit Type and Rh: 6200
Unit Type and Rh: 6200
Unit Type and Rh: 6200

## 2018-01-08 LAB — CBC WITH DIFFERENTIAL/PLATELET
BASOS ABS: 0 10*3/uL (ref 0.0–0.1)
BASOS PCT: 1 %
Basophils Absolute: 0.1 10*3/uL (ref 0.0–0.1)
Basophils Relative: 0 %
EOS PCT: 1 %
Eosinophils Absolute: 0.1 10*3/uL (ref 0.0–0.7)
Eosinophils Absolute: 0.2 10*3/uL (ref 0.0–0.7)
Eosinophils Relative: 2 %
HCT: 21.2 % — ABNORMAL LOW (ref 39.0–52.0)
HEMATOCRIT: 21.1 % — AB (ref 39.0–52.0)
Hemoglobin: 7.3 g/dL — ABNORMAL LOW (ref 13.0–17.0)
Hemoglobin: 7.1 g/dL — ABNORMAL LOW (ref 13.0–17.0)
LYMPHS ABS: 1.2 10*3/uL (ref 0.7–4.0)
LYMPHS ABS: 0.9 10*3/uL (ref 0.7–4.0)
Lymphocytes Relative: 19 %
Lymphocytes Relative: 11 %
MCH: 29.2 pg (ref 26.0–34.0)
MCH: 28.9 pg (ref 26.0–34.0)
MCHC: 34.4 g/dL (ref 30.0–36.0)
MCHC: 33.6 g/dL (ref 30.0–36.0)
MCV: 84.8 fL (ref 78.0–100.0)
MCV: 85.8 fL (ref 78.0–100.0)
MONO ABS: 0.6 10*3/uL (ref 0.1–1.0)
MONOS PCT: 10 %
Monocytes Absolute: 0.8 10*3/uL (ref 0.1–1.0)
Monocytes Relative: 10 %
NEUTROS ABS: 4.4 10*3/uL (ref 1.7–7.7)
NEUTROS ABS: 6.1 10*3/uL (ref 1.7–7.7)
Neutrophils Relative %: 69 %
Neutrophils Relative %: 77 %
PLATELETS: 38 10*3/uL — AB (ref 150–400)
Platelets: 47 10*3/uL — ABNORMAL LOW (ref 150–400)
RBC: 2.5 MIL/uL — ABNORMAL LOW (ref 4.22–5.81)
RBC: 2.46 MIL/uL — ABNORMAL LOW (ref 4.22–5.81)
RDW: 14.4 % (ref 11.5–15.5)
RDW: 14.7 % (ref 11.5–15.5)
WBC: 6.4 10*3/uL (ref 4.0–10.5)
WBC: 8 10*3/uL (ref 4.0–10.5)

## 2018-01-08 LAB — BASIC METABOLIC PANEL
Anion gap: 4 — ABNORMAL LOW (ref 5–15)
BUN: 7 mg/dL (ref 6–20)
CO2: 26 mmol/L (ref 22–32)
Calcium: 7.3 mg/dL — ABNORMAL LOW (ref 8.9–10.3)
Chloride: 111 mmol/L (ref 101–111)
Creatinine, Ser: 0.89 mg/dL (ref 0.61–1.24)
GFR calc Af Amer: >60 mL/min (ref >60)
GLUCOSE: 93 mg/dL (ref 65–99)
POTASSIUM: 3.8 mmol/L (ref 3.5–5.1)
Sodium: 141 mmol/L (ref 135–145)

## 2018-01-08 LAB — PREPARE FRESH FROZEN PLASMA
UNIT DIVISION: 0
UNIT DIVISION: 0
UNIT DIVISION: 0
UNIT DIVISION: 0
UNIT DIVISION: 0
UNIT DIVISION: 0
UNIT DIVISION: 0
UNIT DIVISION: 0
UNIT DIVISION: 0
UNIT DIVISION: 0
UNIT DIVISION: 0
Unit division: 0
Unit division: 0
Unit division: 0
Unit division: 0
Unit division: 0
Unit division: 0
Unit division: 0
Unit division: 0
Unit division: 0
Unit division: 0
Unit division: 0
Unit division: 0
Unit division: 0
Unit division: 0

## 2018-01-08 LAB — PREPARE PLATELET PHERESIS
UNIT DIVISION: 0
UNIT DIVISION: 0
UNIT DIVISION: 0
Unit division: 0

## 2018-01-08 LAB — BPAM CRYOPRECIPITATE
BLOOD PRODUCT EXPIRATION DATE: 201905310658
BLOOD PRODUCT EXPIRATION DATE: 201905310838
Blood Product Expiration Date: 201905310737
Blood Product Expiration Date: 201905310900
ISSUE DATE / TIME: 201905310131
ISSUE DATE / TIME: 201905310151
ISSUE DATE / TIME: 201905310420
ISSUE DATE / TIME: 201905310443
UNIT TYPE AND RH: 5100
UNIT TYPE AND RH: 5100
Unit Type and Rh: 5100
Unit Type and Rh: 5100

## 2018-01-08 LAB — PREPARE CRYOPRECIPITATE
UNIT DIVISION: 0
Unit division: 0
Unit division: 0
Unit division: 0

## 2018-01-08 LAB — BPAM PLATELET PHERESIS
BLOOD PRODUCT EXPIRATION DATE: 201906012359
BLOOD PRODUCT EXPIRATION DATE: 201906022359
Blood Product Expiration Date: 201905312359
Blood Product Expiration Date: 201906022359
ISSUE DATE / TIME: 201905310049
ISSUE DATE / TIME: 201905310137
ISSUE DATE / TIME: 201905310236
ISSUE DATE / TIME: 201905310417
UNIT TYPE AND RH: 5100
UNIT TYPE AND RH: 6200
Unit Type and Rh: 6200
Unit Type and Rh: 7300

## 2018-01-08 LAB — HEMOGLOBIN AND HEMATOCRIT, BLOOD
HCT: 18.3 % — ABNORMAL LOW (ref 39.0–52.0)
HEMOGLOBIN: 6.3 g/dL — AB (ref 13.0–17.0)

## 2018-01-08 MED ORDER — FENTANYL 40 MCG/ML IV SOLN
INTRAVENOUS | Status: DC
  Administered 2018-01-08: 1000 ug via INTRAVENOUS
  Administered 2018-01-09: 75 ug via INTRAVENOUS
  Administered 2018-01-09: 120 ug via INTRAVENOUS
  Administered 2018-01-09: 15 ug via INTRAVENOUS
  Administered 2018-01-09: 1000 ug via INTRAVENOUS
  Administered 2018-01-10: 310 ug via INTRAVENOUS
  Administered 2018-01-10: 135 ug via INTRAVENOUS
  Administered 2018-01-10: 04:00:00 via INTRAVENOUS
  Administered 2018-01-10: 180 ug via INTRAVENOUS
  Filled 2018-01-08: qty 1000
  Filled 2018-01-08: qty 25
  Filled 2018-01-08: qty 1000

## 2018-01-08 MED ORDER — DIPHENHYDRAMINE HCL 12.5 MG/5ML PO ELIX: 12.5000 mg | ORAL_SOLUTION | Freq: Four times a day (QID) | ORAL | Status: DC | PRN

## 2018-01-08 MED ORDER — DIPHENHYDRAMINE HCL 50 MG/ML IJ SOLN: 12.5000 mg | Freq: Four times a day (QID) | INTRAMUSCULAR | Status: DC | PRN

## 2018-01-08 MED ORDER — ACETAMINOPHEN 325 MG PO TABS
650.0000 mg | ORAL_TABLET | Freq: Four times a day (QID) | ORAL | Status: DC | PRN
  Administered 2018-01-08: 650 mg via ORAL
  Filled 2018-01-08: qty 2

## 2018-01-08 MED ORDER — SODIUM CHLORIDE 0.9% FLUSH: 9.0000 mL | INTRAVENOUS | Status: DC | PRN

## 2018-01-08 MED ORDER — SODIUM CHLORIDE 0.9 % IV SOLN: Freq: Once | INTRAVENOUS | Status: DC

## 2018-01-08 MED ORDER — DIPHENHYDRAMINE HCL 50 MG/ML IJ SOLN
12.5000 mg | Freq: Four times a day (QID) | INTRAMUSCULAR | Status: DC | PRN
  Administered 2018-01-11 – 2018-01-14 (×8): 25 mg via INTRAVENOUS
  Filled 2018-01-08 (×9): qty 1

## 2018-01-08 MED ORDER — SODIUM CHLORIDE 0.9 % IV BOLUS
500.0000 mL | Freq: Once | INTRAVENOUS | Status: AC
  Administered 2018-01-08: 500 mL via INTRAVENOUS

## 2018-01-08 MED ORDER — NALOXONE HCL 0.4 MG/ML IJ SOLN: 0.4000 mg | INTRAMUSCULAR | Status: DC | PRN

## 2018-01-08 MED ORDER — ONDANSETRON HCL 4 MG/2ML IJ SOLN: 4.0000 mg | Freq: Four times a day (QID) | INTRAMUSCULAR | Status: DC | PRN

## 2018-01-08 MED ORDER — HYDROMORPHONE HCL 1 MG/ML IJ SOLN
2.0000 mg | Freq: Once | INTRAMUSCULAR | Status: AC
  Administered 2018-01-08: 2 mg via INTRAVENOUS
  Filled 2018-01-08: qty 2

## 2018-01-08 NOTE — Progress Notes (Signed)
Order for extubation confirmed with Dr.Byerly. Order full dose Fentanyl PCA & d/c fentanyl gtt once pt is stable after extubation.
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
## 2018-01-08 NOTE — Procedures (Signed)
Extubation Procedure Note  Patient Details:   Name: Antonio Torres DOB: 01/14/1996 MRN: [id number]   Airway Documentation:    Vent end date: 01/08/18 Vent end time: 1521   Evaluation  O2 sats: stable throughout Complications: No apparent complications Patient did tolerate procedure well. Bilateral Breath Sounds: Clear, Diminished   Yes   Pt extubated to 2L N/C.  No stridor noted.  RN @ bedside.  Christophe LouisSteven D Fatim Vanderschaaf 01/08/2018, 3:22 PM

## 2018-01-08 NOTE — Progress Notes (Signed)
160cc of fentanyl wasted with Butch PennyNick RN.
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
## 2018-01-09 ENCOUNTER — Encounter (HOSPITAL_COMMUNITY): Admission: EM | Disposition: A | Discharge status: Home Health | Payer: Self-pay | Source: Home / Self Care

## 2018-01-09 ENCOUNTER — Inpatient Hospital Stay (HOSPITAL_COMMUNITY): Payer: Medicaid Other | Admitting: Certified Registered Nurse Anesthetist

## 2018-01-09 ENCOUNTER — Encounter (HOSPITAL_COMMUNITY): Payer: Self-pay | Admitting: Surgery

## 2018-01-09 HISTORY — PX: APPLICATION OF WOUND VAC: SHX5189

## 2018-01-09 LAB — CBC
HCT: 22.2 % — ABNORMAL LOW (ref 39.0–52.0)
HEMATOCRIT: 22.2 % — AB (ref 39.0–52.0)
Hemoglobin: 7.5 g/dL — ABNORMAL LOW (ref 13.0–17.0)
Hemoglobin: 7.5 g/dL — ABNORMAL LOW (ref 13.0–17.0)
MCH: 28.1 pg (ref 26.0–34.0)
MCH: 28.2 pg (ref 26.0–34.0)
MCHC: 33.8 g/dL (ref 30.0–36.0)
MCHC: 33.8 g/dL (ref 30.0–36.0)
MCV: 83.1 fL (ref 78.0–100.0)
MCV: 83.5 fL (ref 78.0–100.0)
PLATELETS: 118 10*3/uL — AB (ref 150–400)
Platelets: 131 10*3/uL — ABNORMAL LOW (ref 150–400)
RBC: 2.67 MIL/uL — AB (ref 4.22–5.81)
RBC: 2.66 MIL/uL — AB (ref 4.22–5.81)
RDW: 15.5 % (ref 11.5–15.5)
RDW: 15.6 % — AB (ref 11.5–15.5)
WBC: 7.8 10*3/uL (ref 4.0–10.5)
WBC: 8.2 10*3/uL (ref 4.0–10.5)

## 2018-01-09 LAB — BASIC METABOLIC PANEL
ANION GAP: 6 (ref 5–15)
BUN: 5 mg/dL — ABNORMAL LOW (ref 6–20)
CHLORIDE: 105 mmol/L (ref 101–111)
CO2: 26 mmol/L (ref 22–32)
Calcium: 7.7 mg/dL — ABNORMAL LOW (ref 8.9–10.3)
Creatinine, Ser: 0.74 mg/dL (ref 0.61–1.24)
GFR calc Af Amer: >60 mL/min (ref >60)
GFR calc non Af Amer: >60 mL/min (ref >60)
GLUCOSE: 90 mg/dL (ref 65–99)
POTASSIUM: 3.9 mmol/L (ref 3.5–5.1)
Sodium: 137 mmol/L (ref 135–145)

## 2018-01-09 LAB — PREPARE RBC (CROSSMATCH)

## 2018-01-09 SURGERY — APPLICATION, WOUND VAC
Anesthesia: General | Laterality: Left

## 2018-01-09 MED ORDER — FENTANYL CITRATE (PF) 250 MCG/5ML IJ SOLN
INTRAMUSCULAR | Status: AC
  Filled 2018-01-09: qty 5

## 2018-01-09 MED ORDER — CEFAZOLIN SODIUM-DEXTROSE 2-4 GM/100ML-% IV SOLN
INTRAVENOUS | Status: AC
  Filled 2018-01-09: qty 100

## 2018-01-09 MED ORDER — DEXAMETHASONE SODIUM PHOSPHATE 10 MG/ML IJ SOLN
INTRAMUSCULAR | Status: AC
  Filled 2018-01-09: qty 1

## 2018-01-09 MED ORDER — PROMETHAZINE HCL 25 MG/ML IJ SOLN: 6.2500 mg | INTRAMUSCULAR | Status: DC | PRN

## 2018-01-09 MED ORDER — FENTANYL CITRATE (PF) 100 MCG/2ML IJ SOLN: 25.0000 ug | INTRAMUSCULAR | Status: DC | PRN

## 2018-01-09 MED ORDER — SUCCINYLCHOLINE CHLORIDE 200 MG/10ML IV SOSY
PREFILLED_SYRINGE | INTRAVENOUS | Status: AC
  Filled 2018-01-09: qty 10

## 2018-01-09 MED ORDER — OXYCODONE HCL 5 MG PO TABS
5.0000 mg | ORAL_TABLET | ORAL | Status: DC | PRN
  Administered 2018-01-09 – 2018-01-17 (×17): 10 mg via ORAL
  Filled 2018-01-09 (×19): qty 2

## 2018-01-09 MED ORDER — CEFAZOLIN SODIUM-DEXTROSE 2-3 GM-%(50ML) IV SOLR
INTRAVENOUS | Status: DC | PRN
  Administered 2018-01-09: 2 g via INTRAVENOUS

## 2018-01-09 MED ORDER — MIDAZOLAM HCL 2 MG/2ML IJ SOLN
INTRAMUSCULAR | Status: AC
  Filled 2018-01-09: qty 2

## 2018-01-09 MED ORDER — LIDOCAINE HCL (CARDIAC) PF 100 MG/5ML IV SOSY
PREFILLED_SYRINGE | INTRAVENOUS | Status: DC | PRN
  Administered 2018-01-09: 60 mg via INTRAVENOUS

## 2018-01-09 MED ORDER — LACTATED RINGERS IV SOLN
INTRAVENOUS | Status: DC | PRN
  Administered 2018-01-09 (×2): via INTRAVENOUS

## 2018-01-09 MED ORDER — SUGAMMADEX SODIUM 200 MG/2ML IV SOLN
INTRAVENOUS | Status: AC
  Filled 2018-01-09: qty 2

## 2018-01-09 MED ORDER — ROCURONIUM BROMIDE 10 MG/ML (PF) SYRINGE
PREFILLED_SYRINGE | INTRAVENOUS | Status: AC
  Filled 2018-01-09: qty 5

## 2018-01-09 MED ORDER — PROPOFOL 10 MG/ML IV BOLUS
INTRAVENOUS | Status: DC | PRN
  Administered 2018-01-09: 140 mg via INTRAVENOUS

## 2018-01-09 MED ORDER — PROPOFOL 10 MG/ML IV BOLUS
INTRAVENOUS | Status: AC
  Filled 2018-01-09: qty 20

## 2018-01-09 MED ORDER — FENTANYL CITRATE (PF) 100 MCG/2ML IJ SOLN
INTRAMUSCULAR | Status: DC | PRN
  Administered 2018-01-09 (×3): 50 ug via INTRAVENOUS

## 2018-01-09 MED ORDER — SODIUM CHLORIDE 0.9 % IR SOLN
Status: DC | PRN
  Administered 2018-01-09: 1000 mL

## 2018-01-09 MED ORDER — ONDANSETRON HCL 4 MG/2ML IJ SOLN
INTRAMUSCULAR | Status: DC | PRN
  Administered 2018-01-09: 4 mg via INTRAVENOUS

## 2018-01-09 MED ORDER — PHENYLEPHRINE 40 MCG/ML (10ML) SYRINGE FOR IV PUSH (FOR BLOOD PRESSURE SUPPORT)
PREFILLED_SYRINGE | INTRAVENOUS | Status: AC
  Filled 2018-01-09: qty 10

## 2018-01-09 MED ORDER — PHENYLEPHRINE HCL 10 MG/ML IJ SOLN
INTRAMUSCULAR | Status: DC | PRN
  Administered 2018-01-09 (×2): 40 ug via INTRAVENOUS

## 2018-01-09 MED ORDER — ALBUTEROL SULFATE HFA 108 (90 BASE) MCG/ACT IN AERS
INHALATION_SPRAY | RESPIRATORY_TRACT | Status: DC | PRN
  Administered 2018-01-09: 2 via RESPIRATORY_TRACT

## 2018-01-09 MED ORDER — ALBUTEROL SULFATE (2.5 MG/3ML) 0.083% IN NEBU
3.0000 mL | INHALATION_SOLUTION | Freq: Four times a day (QID) | RESPIRATORY_TRACT | Status: DC | PRN
  Administered 2018-01-11: 3 mL via RESPIRATORY_TRACT
  Filled 2018-01-09: qty 3

## 2018-01-09 MED ORDER — ALBUTEROL SULFATE HFA 108 (90 BASE) MCG/ACT IN AERS
INHALATION_SPRAY | RESPIRATORY_TRACT | Status: AC
  Filled 2018-01-09: qty 6.7

## 2018-01-09 MED ORDER — MIDAZOLAM HCL 5 MG/5ML IJ SOLN
INTRAMUSCULAR | Status: DC | PRN
  Administered 2018-01-09: 2 mg via INTRAVENOUS

## 2018-01-09 MED ORDER — ONDANSETRON HCL 4 MG/2ML IJ SOLN
INTRAMUSCULAR | Status: AC
  Filled 2018-01-09: qty 2

## 2018-01-09 SURGICAL SUPPLY — 46 items
BANDAGE ESMARK 6X9 LF (GAUZE/BANDAGES/DRESSINGS) IMPLANT
BNDG CMPR 9X6 STRL LF SNTH (GAUZE/BANDAGES/DRESSINGS)
BNDG ESMARK 6X9 LF (GAUZE/BANDAGES/DRESSINGS)
CANISTER SUCT 3000ML PPV (MISCELLANEOUS) ×3 IMPLANT
CANISTER WOUND CARE 500ML ATS (WOUND CARE) ×8 IMPLANT
CUFF TOURNIQUET SINGLE 18IN (TOURNIQUET CUFF) IMPLANT
CUFF TOURNIQUET SINGLE 24IN (TOURNIQUET CUFF) IMPLANT
CUFF TOURNIQUET SINGLE 34IN LL (TOURNIQUET CUFF) IMPLANT
CUFF TOURNIQUET SINGLE 44IN (TOURNIQUET CUFF) IMPLANT
DRAIN CHANNEL 15F RND FF W/TCR (WOUND CARE) IMPLANT
DRAPE INCISE IOBAN 66X45 STRL (DRAPES) ×4 IMPLANT
DRSG VAC ATS LRG SENSATRAC (GAUZE/BANDAGES/DRESSINGS) ×6 IMPLANT
DRSG VAC ATS MED SENSATRAC (GAUZE/BANDAGES/DRESSINGS) ×2 IMPLANT
DRSG VAC ATS SM SENSATRAC (GAUZE/BANDAGES/DRESSINGS) ×2 IMPLANT
ELECT REM PT RETURN 9FT ADLT (ELECTROSURGICAL) ×3
ELECTRODE REM PT RTRN 9FT ADLT (ELECTROSURGICAL) ×1 IMPLANT
EVACUATOR SILICONE 100CC (DRAIN) IMPLANT
GLOVE BIO SURGEON STRL SZ7.5 (GLOVE) ×3 IMPLANT
GLOVE BIOGEL PI IND STRL 6.5 (GLOVE) IMPLANT
GLOVE BIOGEL PI IND STRL 7.5 (GLOVE) IMPLANT
GLOVE BIOGEL PI IND STRL 8 (GLOVE) ×1 IMPLANT
GLOVE BIOGEL PI INDICATOR 6.5 (GLOVE) ×2
GLOVE BIOGEL PI INDICATOR 7.5 (GLOVE) ×4
GLOVE BIOGEL PI INDICATOR 8 (GLOVE) ×2
GLOVE SURG SS PI 7.5 STRL IVOR (GLOVE) ×4 IMPLANT
GOWN STRL REUS W/ TWL LRG LVL3 (GOWN DISPOSABLE) ×3 IMPLANT
GOWN STRL REUS W/TWL LRG LVL3 (GOWN DISPOSABLE) ×9
KIT BASIN OR (CUSTOM PROCEDURE TRAY) ×3 IMPLANT
KIT TURNOVER KIT B (KITS) ×3 IMPLANT
NS IRRIG 1000ML POUR BTL (IV SOLUTION) ×6 IMPLANT
PACK CV ACCESS (CUSTOM PROCEDURE TRAY) IMPLANT
PACK GENERAL/GYN (CUSTOM PROCEDURE TRAY) IMPLANT
PACK PERIPHERAL VASCULAR (CUSTOM PROCEDURE TRAY) ×2 IMPLANT
PACK UNIVERSAL I (CUSTOM PROCEDURE TRAY) IMPLANT
PAD ARMBOARD 7.5X6 YLW CONV (MISCELLANEOUS) ×6 IMPLANT
STAPLER VISISTAT (STAPLE) IMPLANT
SUT PROLENE 5 0 C 1 24 (SUTURE) IMPLANT
SUT PROLENE 6 0 BV (SUTURE) IMPLANT
SUT VIC AB 2-0 CTB1 (SUTURE) IMPLANT
SUT VIC AB 3-0 SH 27 (SUTURE)
SUT VIC AB 3-0 SH 27X BRD (SUTURE) IMPLANT
SUT VICRYL 4-0 PS2 18IN ABS (SUTURE) IMPLANT
TOWEL GREEN STERILE (TOWEL DISPOSABLE) ×3 IMPLANT
TRAY FOLEY MTR SLVR 16FR STAT (SET/KITS/TRAYS/PACK) IMPLANT
UNDERPAD 30X30 (UNDERPADS AND DIAPERS) ×3 IMPLANT
WATER STERILE IRR 1000ML POUR (IV SOLUTION) ×3 IMPLANT

## 2018-01-09 NOTE — Anesthesia Postprocedure Evaluation (Signed)
Anesthesia Post Note  Patient: Antonio Torres  Procedure(s) Performed: WOUND VAC CHANGE LEFT UPPER MEDIAL AND LATERAL THIGH AND AND LEFT LOWER LEG MEDIAL AND LATERAL. (Left )     Patient location during evaluation: PACU Anesthesia Type: General Level of consciousness: awake and alert Pain management: pain level controlled Vital Signs Assessment: post-procedure vital signs reviewed and stable Respiratory status: spontaneous breathing, nonlabored ventilation, respiratory function stable and patient connected to nasal cannula oxygen Cardiovascular status: blood pressure returned to baseline and stable Postop Assessment: no apparent nausea or vomiting Anesthetic complications: no    Last Vitals:  Vitals:   01/09/18 1047 01/09/18 1050  BP:    Pulse: 83 84  Resp: 18 18  Temp:    SpO2: 99% 100%    Last Pain:  Vitals:   01/09/18 0800  TempSrc: Bladder  PainSc: Asleep                 Kennieth RadFitzgerald, Theona Muhs E
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
## 2018-01-09 NOTE — Progress Notes (Signed)
Pt continues to bleed grossly from L upper wound vac site, called Dr. Donell BeersByerly, drain site and reinforce per order.

## 2018-01-09 NOTE — Anesthesia Procedure Notes (Signed)
Procedure Name: LMA Insertion Date/Time: 01/09/2018 9:05 AM Performed by: Adonis Housekeeperongell, Rahsaan Weakland M, CRNA Pre-anesthesia Checklist: Patient identified, Emergency Drugs available, Suction available and Patient being monitored Patient Re-evaluated:Patient Re-evaluated prior to induction Oxygen Delivery Method: Circle system utilized Preoxygenation: Pre-oxygenation with 100% oxygen Induction Type: IV induction Ventilation: Mask ventilation without difficulty LMA: LMA inserted LMA Size: 5.0 Number of attempts: 1 Placement Confirmation: positive ETCO2 and breath sounds checked- equal and bilateral Tube secured with: Tape Dental Injury: Teeth and Oropharynx as per pre-operative assessment
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
## 2018-01-09 NOTE — Progress Notes (Signed)
The patients left lateral upper wound vac has been leaking profusely and he has lost quite a bit of blood d/t this.  I called Trauma MD who ordered 2 units of platelets and 2 units of PRBCs to be given along with an H&H beforehand and was told to try and repair the leak if possible.

## 2018-01-09 NOTE — Progress Notes (Signed)
The patient is continuing to bleed a lot from the leaks in the upper lateral wound vac and the dressing placed over top has required multiple changes to keep clean. hgb was 7.5 and hct was 22 after 2 RBCs and 2 of platelets were given.

## 2018-01-09 NOTE — Progress Notes (Signed)
In the process of trying to fix the leak I found that there was a huge clot causing the problem and not allowing the wound vac to function properly.  I contacted Vascular on call MD, as they were the ones who placed the wound vac's, and was told to take the wound vac off completely and pack the wound with a wet to dry dressing and put a compression wrap over it, until they could take him back to the OR in the AM.  After verifying that he knew how big the wound was and the amount that it was bleeding he still informed me to do this.  When I explained to the patient what the doctor had ordered he refused letting me do this treatment and said he didn't want me taking it off.  The patient was only okay with me putting ABD's and a compression wrap on the outside of the wound vac and changing them as needed when they became soiled.

## 2018-01-09 NOTE — Progress Notes (Signed)
Pt back from OR

## 2018-01-09 NOTE — Progress Notes (Signed)
Follow up call to OR nurse to confirm settings for Left lateral upper NPWT - set at 125mmHg in PACU per orders. Confirmed verbal orders w/ Trecia RogersStacy P, RN via phone that Left Lateral upper wound vac shoul;d be set at 14250mm/Hg. See modified orders. Follow up call to Mia, RN on 4N updated regarding change in settings. Mia, RN confirmed via phone change in settings.

## 2018-01-09 NOTE — Transfer of Care (Signed)
Immediate Anesthesia Transfer of Care Note  Patient: Antonio Torres  Procedure(s) Performed: CHANGE WOUND VAC (Left )  Patient Location: PACU  Anesthesia Type:General  Level of Consciousness: drowsy  Airway & Oxygen Therapy: Patient Spontanous Breathing and Patient connected to nasal cannula oxygen  Post-op Assessment: Report given to RN and Post -op Vital signs reviewed and stable  Post vital signs: Reviewed and stable  Last Vitals:  Vitals Value Taken Time  BP 122/64 01/09/2018 10:21 AM  Temp    Pulse 94 01/09/2018 10:29 AM  Resp 21 01/09/2018 10:29 AM  SpO2 98 % 01/09/2018 10:29 AM  Vitals shown include unvalidated device data.  Last Pain:  Vitals:   01/09/18 0800  TempSrc: Bladder  PainSc: Asleep      Patients Stated Pain Goal: 0 (01/08/18 1600)  Complications: No apparent anesthesia complications

## 2018-01-09 NOTE — Anesthesia Preprocedure Evaluation (Signed)
Anesthesia Evaluation  Patient identified by MRN, date of birth, ID band Patient awake    Reviewed: Allergy & Precautions, NPO status , Patient's Chart, lab work & pertinent test results  Airway Mallampati: II  TM Distance: >3 FB Neck ROM: Full    Dental  (+) Dental Advisory Given   Pulmonary neg pulmonary ROS,    breath sounds clear to auscultation       Cardiovascular negative cardio ROS   Rhythm:Regular Rate:Normal     Neuro/Psych negative neurological ROS     GI/Hepatic negative GI ROS, Neg liver ROS,   Endo/Other  negative endocrine ROS  Renal/GU negative Renal ROS     Musculoskeletal   Abdominal   Peds  Hematology  (+) anemia ,   Anesthesia Other Findings   Reproductive/Obstetrics                             Lab Results  Component Value Date   WBC 7.8 01/09/2018   HGB 7.5 (L) 01/09/2018   HCT 22.2 (L) 01/09/2018   MCV 83.1 01/09/2018   PLT 131 (L) 01/09/2018   Lab Results  Component Value Date   CREATININE 0.74 01/09/2018   BUN 5 (L) 01/09/2018   NA 137 01/09/2018   K 3.9 01/09/2018   CL 105 01/09/2018   CO2 26 01/09/2018    Anesthesia Physical Anesthesia Plan  ASA: [name]  Anesthesia Plan: General   Post-op Pain Management:    Induction: Intravenous  PONV Risk Score and Plan: 2 and Ondansetron, Dexamethasone and Treatment may vary due to age or medical condition  Airway Management Planned: LMA  Additional Equipment:   Intra-op Plan:   Post-operative Plan: Extubation in OR  Informed Consent: I have reviewed the patients History and Physical, chart, labs and discussed the procedure including the risks, benefits and alternatives for the proposed anesthesia with the patient or authorized representative who has indicated his/her understanding and acceptance.   Dental advisory given  Plan Discussed with: CRNA  Anesthesia Plan Comments:          Anesthesia Quick Evaluation

## 2018-01-09 NOTE — Progress Notes (Signed)
PCA patient history 1200-1600 Dose received: 105 mcgs Dose demand: 13 Dose delivered: 10

## 2018-01-10 ENCOUNTER — Encounter (HOSPITAL_COMMUNITY): Payer: Self-pay | Admitting: Vascular Surgery

## 2018-01-10 LAB — TYPE AND SCREEN
ABO/RH(D): O POS
Antibody Screen: NEGATIVE
UNIT DIVISION: 0
UNIT DIVISION: 0
UNIT DIVISION: 0
UNIT DIVISION: 0
UNIT DIVISION: 0
UNIT DIVISION: 0
UNIT DIVISION: 0
UNIT DIVISION: 0
UNIT DIVISION: 0
UNIT DIVISION: 0
UNIT DIVISION: 0
UNIT DIVISION: 0
UNIT DIVISION: 0
UNIT DIVISION: 0
UNIT DIVISION: 0
UNIT DIVISION: 0
Unit division: 0
Unit division: 0
Unit division: 0
Unit division: 0
Unit division: 0
Unit division: 0
Unit division: 0
Unit division: 0
Unit division: 0
Unit division: 0
Unit division: 0
Unit division: 0
Unit division: 0
Unit division: 0
Unit division: 0
Unit division: 0
Unit division: 0
Unit division: 0
Unit division: 0
Unit division: 0

## 2018-01-10 LAB — BPAM RBC
BLOOD PRODUCT EXPIRATION DATE: 201906252359
BLOOD PRODUCT EXPIRATION DATE: 201906252359
BLOOD PRODUCT EXPIRATION DATE: 201906252359
BLOOD PRODUCT EXPIRATION DATE: 201906262359
BLOOD PRODUCT EXPIRATION DATE: 201906262359
BLOOD PRODUCT EXPIRATION DATE: 201906262359
BLOOD PRODUCT EXPIRATION DATE: 201906262359
BLOOD PRODUCT EXPIRATION DATE: 201906262359
BLOOD PRODUCT EXPIRATION DATE: 201906262359
BLOOD PRODUCT EXPIRATION DATE: 201906262359
BLOOD PRODUCT EXPIRATION DATE: 201906262359
BLOOD PRODUCT EXPIRATION DATE: 201906262359
BLOOD PRODUCT EXPIRATION DATE: 201906262359
BLOOD PRODUCT EXPIRATION DATE: 201906262359
BLOOD PRODUCT EXPIRATION DATE: 201906282359
BLOOD PRODUCT EXPIRATION DATE: 201906292359
BLOOD PRODUCT EXPIRATION DATE: 201907032359
Blood Product Expiration Date: 201906252359
Blood Product Expiration Date: 201906252359
Blood Product Expiration Date: 201906262359
Blood Product Expiration Date: 201906262359
Blood Product Expiration Date: 201906262359
Blood Product Expiration Date: 201906262359
Blood Product Expiration Date: 201906262359
Blood Product Expiration Date: 201906262359
Blood Product Expiration Date: 201906262359
Blood Product Expiration Date: 201906262359
Blood Product Expiration Date: 201906262359
Blood Product Expiration Date: 201906262359
Blood Product Expiration Date: 201906262359
Blood Product Expiration Date: 201906262359
Blood Product Expiration Date: 201906272359
Blood Product Expiration Date: 201906272359
Blood Product Expiration Date: 201906272359
Blood Product Expiration Date: 201906292359
Blood Product Expiration Date: 201907022359
ISSUE DATE / TIME: 201905302355
ISSUE DATE / TIME: 201905310035
ISSUE DATE / TIME: 201905310046
ISSUE DATE / TIME: 201905310054
ISSUE DATE / TIME: 201905310102
ISSUE DATE / TIME: 201905310128
ISSUE DATE / TIME: 201905310134
ISSUE DATE / TIME: 201905310134
ISSUE DATE / TIME: 201905310156
ISSUE DATE / TIME: 201905310308
ISSUE DATE / TIME: 201905311051
ISSUE DATE / TIME: 201905311224
ISSUE DATE / TIME: 201905311231
ISSUE DATE / TIME: 201905311429
ISSUE DATE / TIME: 201905311512
ISSUE DATE / TIME: 201905311619
ISSUE DATE / TIME: 201905311633
ISSUE DATE / TIME: 201905311851
ISSUE DATE / TIME: 201905311856
ISSUE DATE / TIME: 201906020121
ISSUE DATE / TIME: 201906020248
ISSUE DATE / TIME: 201905302328
ISSUE DATE / TIME: 201905302328
ISSUE DATE / TIME: 201905302355
ISSUE DATE / TIME: 201905310035
ISSUE DATE / TIME: 201905310046
ISSUE DATE / TIME: 201905310054
ISSUE DATE / TIME: 201905310102
ISSUE DATE / TIME: 201905310128
ISSUE DATE / TIME: 201905310134
ISSUE DATE / TIME: 201905310134
ISSUE DATE / TIME: 201905310156
ISSUE DATE / TIME: 201905311231
ISSUE DATE / TIME: 201905311304
ISSUE DATE / TIME: 201905311441
ISSUE DATE / TIME: 201906010043
UNIT TYPE AND RH: 5100
UNIT TYPE AND RH: 5100
UNIT TYPE AND RH: 5100
UNIT TYPE AND RH: 5100
UNIT TYPE AND RH: 5100
UNIT TYPE AND RH: 5100
UNIT TYPE AND RH: 5100
UNIT TYPE AND RH: 5100
UNIT TYPE AND RH: 5100
UNIT TYPE AND RH: 5100
UNIT TYPE AND RH: 5100
UNIT TYPE AND RH: 5100
UNIT TYPE AND RH: 5100
UNIT TYPE AND RH: 9500
UNIT TYPE AND RH: 9500
UNIT TYPE AND RH: 9500
UNIT TYPE AND RH: 9500
Unit Type and Rh: 5100
Unit Type and Rh: 5100
Unit Type and Rh: 5100
Unit Type and Rh: 5100
Unit Type and Rh: 5100
Unit Type and Rh: 5100
Unit Type and Rh: 5100
Unit Type and Rh: 5100
Unit Type and Rh: 5100
Unit Type and Rh: 5100
Unit Type and Rh: 5100
Unit Type and Rh: 5100
Unit Type and Rh: 5100
Unit Type and Rh: 5100
Unit Type and Rh: 5100
Unit Type and Rh: 5100
Unit Type and Rh: 5100
Unit Type and Rh: 9500
Unit Type and Rh: 9500

## 2018-01-10 LAB — CBC
HEMATOCRIT: 21.7 % — AB (ref 39.0–52.0)
Hemoglobin: 7.3 g/dL — ABNORMAL LOW (ref 13.0–17.0)
MCH: 28.4 pg (ref 26.0–34.0)
MCHC: 33.6 g/dL (ref 30.0–36.0)
MCV: 84.4 fL (ref 78.0–100.0)
Platelets: 115 10*3/uL — ABNORMAL LOW (ref 150–400)
RBC: 2.57 MIL/uL — ABNORMAL LOW (ref 4.22–5.81)
RDW: 15.3 % (ref 11.5–15.5)
WBC: 7.4 10*3/uL (ref 4.0–10.5)

## 2018-01-10 LAB — PREPARE PLATELET PHERESIS
Unit division: 0
Unit division: 0

## 2018-01-10 LAB — BPAM PLATELET PHERESIS
BLOOD PRODUCT EXPIRATION DATE: 201906032359
Blood Product Expiration Date: 201906032359
ISSUE DATE / TIME: 201906020025
ISSUE DATE / TIME: 201906020416
Unit Type and Rh: 5100
Unit Type and Rh: 8400

## 2018-01-10 LAB — BASIC METABOLIC PANEL
Anion gap: 3 — ABNORMAL LOW (ref 5–15)
CALCIUM: 7.9 mg/dL — AB (ref 8.9–10.3)
CO2: 29 mmol/L (ref 22–32)
CREATININE: 0.77 mg/dL (ref 0.61–1.24)
Chloride: 104 mmol/L (ref 101–111)
GFR calc Af Amer: >60 mL/min (ref >60)
GFR calc non Af Amer: >60 mL/min (ref >60)
GLUCOSE: 95 mg/dL (ref 65–99)
Potassium: 3.9 mmol/L (ref 3.5–5.1)
Sodium: 136 mmol/L (ref 135–145)

## 2018-01-10 MED ORDER — FENTANYL 40 MCG/ML IV SOLN
INTRAVENOUS | Status: DC
  Administered 2018-01-11: 16:00:00 via INTRAVENOUS
  Administered 2018-01-11: 105 ug via INTRAVENOUS
  Administered 2018-01-12: 60 ug via INTRAVENOUS
  Administered 2018-01-12: 14:00:00 via INTRAVENOUS
  Administered 2018-01-12: 40 ug via INTRAVENOUS
  Administered 2018-01-12: 105 ug via INTRAVENOUS
  Administered 2018-01-12: 150 ug via INTRAVENOUS
  Administered 2018-01-12: 105 ug via INTRAVENOUS
  Administered 2018-01-12: 75 ug via INTRAVENOUS
  Administered 2018-01-13: 0 ug via INTRAVENOUS
  Administered 2018-01-13: 45 ug via INTRAVENOUS
  Filled 2018-01-10 (×2): qty 25

## 2018-01-10 MED ORDER — GABAPENTIN 300 MG PO CAPS
300.0000 mg | ORAL_CAPSULE | Freq: Three times a day (TID) | ORAL | Status: DC | PRN
  Administered 2018-01-10 (×3): 300 mg via ORAL
  Filled 2018-01-10 (×3): qty 1

## 2018-01-10 MED ORDER — NALOXONE HCL 0.4 MG/ML IJ SOLN: 0.4000 mg | INTRAMUSCULAR | Status: DC | PRN

## 2018-01-10 MED ORDER — ONDANSETRON HCL 4 MG/2ML IJ SOLN: 4.0000 mg | Freq: Four times a day (QID) | INTRAMUSCULAR | Status: DC | PRN

## 2018-01-10 MED ORDER — FENTANYL BOLUS VIA INFUSION
50.0000 ug | INTRAVENOUS | Status: DC | PRN
  Administered 2018-01-11: 50 ug via INTRAVENOUS
  Filled 2018-01-10: qty 50

## 2018-01-10 MED ORDER — ENSURE ENLIVE PO LIQD
237.0000 mL | Freq: Two times a day (BID) | ORAL | Status: DC
  Administered 2018-01-10 – 2018-01-15 (×6): 237 mL via ORAL

## 2018-01-10 MED ORDER — POLYETHYLENE GLYCOL 3350 17 G PO PACK
17.0000 g | PACK | Freq: Every day | ORAL | Status: DC
  Administered 2018-01-10 – 2018-01-15 (×3): 17 g via ORAL
  Filled 2018-01-10 (×7): qty 1

## 2018-01-10 MED ORDER — SODIUM CHLORIDE 0.9% FLUSH
9.0000 mL | INTRAVENOUS | Status: DC | PRN
  Administered 2018-01-13: 9 mL via INTRAVENOUS
  Filled 2018-01-10: qty 9
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
## 2018-01-10 NOTE — Progress Notes (Signed)
Pt continuously complaining of pain in his LLE even after maxing out PCA multiple times and all PRN medications being given.  After talking with the pt more I suspect the pain might be nerve related.  Verbal order for gabapentin 300mg  given by Dr. Janee Mornhompson to see if this helps.  Will give and continue to monitor pain level.
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
## 2018-01-10 NOTE — Progress Notes (Signed)
IV Team: Spoke with pt's nurse. RN will DC central line. Pt has PIVs.
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
## 2018-01-10 NOTE — Progress Notes (Signed)
Orthopedic Tech Progress Note Patient Details:  Antonio Torres 07/27/1996 [id number]  Ortho Devices Type of Ortho Device: Prafo boot/shoe Ortho Device/Splint Location: LLE Ortho Device/Splint Interventions: Ordered, Application   Post Interventions Patient Tolerated: Well Instructions Provided: Care of device   [name], [name] 01/10/2018, 5:57 PM
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
## 2018-01-11 MED ORDER — FUROSEMIDE 10 MG/ML IJ SOLN
20.0000 mg | Freq: Once | INTRAMUSCULAR | Status: AC
  Administered 2018-01-11: 20 mg via INTRAVENOUS
  Filled 2018-01-11: qty 2

## 2018-01-11 MED ORDER — CEFAZOLIN SODIUM-DEXTROSE 1-4 GM/50ML-% IV SOLN
1.0000 g | INTRAVENOUS | Status: AC
  Administered 2018-01-12: 1 g via INTRAVENOUS
  Filled 2018-01-11: qty 50
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
## 2018-01-12 ENCOUNTER — Encounter (HOSPITAL_COMMUNITY): Admission: EM | Disposition: A | Discharge status: Home Health | Payer: Self-pay | Source: Home / Self Care

## 2018-01-12 ENCOUNTER — Inpatient Hospital Stay (HOSPITAL_COMMUNITY): Payer: Medicaid Other | Admitting: Anesthesiology

## 2018-01-12 ENCOUNTER — Encounter (HOSPITAL_COMMUNITY): Payer: Self-pay | Admitting: Surgery

## 2018-01-12 HISTORY — PX: APPLICATION OF WOUND VAC: SHX5189

## 2018-01-12 LAB — BASIC METABOLIC PANEL
ANION GAP: 7 (ref 5–15)
BUN: 12 mg/dL (ref 6–20)
CALCIUM: 8.9 mg/dL (ref 8.9–10.3)
CO2: 29 mmol/L (ref 22–32)
Chloride: 100 mmol/L — ABNORMAL LOW (ref 101–111)
Creatinine, Ser: 0.81 mg/dL (ref 0.61–1.24)
Glucose, Bld: 101 mg/dL — ABNORMAL HIGH (ref 65–99)
Potassium: 3.4 mmol/L — ABNORMAL LOW (ref 3.5–5.1)
Sodium: 136 mmol/L (ref 135–145)

## 2018-01-12 LAB — CBC
HCT: 29 % — ABNORMAL LOW (ref 39.0–52.0)
HEMOGLOBIN: 9.4 g/dL — AB (ref 13.0–17.0)
MCH: 27.8 pg (ref 26.0–34.0)
MCHC: 32.4 g/dL (ref 30.0–36.0)
MCV: 85.8 fL (ref 78.0–100.0)
Platelets: 235 10*3/uL (ref 150–400)
RBC: 3.38 MIL/uL — AB (ref 4.22–5.81)
RDW: 15.1 % (ref 11.5–15.5)
WBC: 7 10*3/uL (ref 4.0–10.5)

## 2018-01-12 LAB — PROTIME-INR
INR: 1.09
PROTHROMBIN TIME: 14 s (ref 11.4–15.2)

## 2018-01-12 SURGERY — APPLICATION, WOUND VAC
Anesthesia: General | Site: Leg Lower | Laterality: Left

## 2018-01-12 MED ORDER — DEXAMETHASONE SODIUM PHOSPHATE 10 MG/ML IJ SOLN
INTRAMUSCULAR | Status: DC | PRN
  Administered 2018-01-12: 10 mg via INTRAVENOUS

## 2018-01-12 MED ORDER — FENTANYL CITRATE (PF) 250 MCG/5ML IJ SOLN
INTRAMUSCULAR | Status: AC
Start: 2018-01-12 — End: ?
  Filled 2018-01-12: qty 5

## 2018-01-12 MED ORDER — PROPOFOL 10 MG/ML IV BOLUS
INTRAVENOUS | Status: DC | PRN
  Administered 2018-01-12: 150 mg via INTRAVENOUS

## 2018-01-12 MED ORDER — GABAPENTIN 300 MG PO CAPS
300.0000 mg | ORAL_CAPSULE | Freq: Three times a day (TID) | ORAL | Status: DC
  Administered 2018-01-12 – 2018-01-17 (×14): 300 mg via ORAL
  Filled 2018-01-12 (×14): qty 1

## 2018-01-12 MED ORDER — MIDAZOLAM HCL 2 MG/2ML IJ SOLN
INTRAMUSCULAR | Status: AC
  Filled 2018-01-12: qty 2

## 2018-01-12 MED ORDER — ONDANSETRON HCL 4 MG/2ML IJ SOLN
INTRAMUSCULAR | Status: DC | PRN
  Administered 2018-01-12: 4 mg via INTRAVENOUS

## 2018-01-12 MED ORDER — DEXAMETHASONE SODIUM PHOSPHATE 10 MG/ML IJ SOLN
INTRAMUSCULAR | Status: AC
  Filled 2018-01-12: qty 1

## 2018-01-12 MED ORDER — ACETAMINOPHEN 500 MG PO TABS
1000.0000 mg | ORAL_TABLET | Freq: Three times a day (TID) | ORAL | Status: DC
  Administered 2018-01-12 – 2018-01-17 (×14): 1000 mg via ORAL
  Filled 2018-01-12 (×15): qty 2

## 2018-01-12 MED ORDER — 0.9 % SODIUM CHLORIDE (POUR BTL) OPTIME
TOPICAL | Status: DC | PRN
  Administered 2018-01-12: 1000 mL

## 2018-01-12 MED ORDER — ONDANSETRON HCL 4 MG/2ML IJ SOLN
INTRAMUSCULAR | Status: AC
  Filled 2018-01-12: qty 2

## 2018-01-12 MED ORDER — DEXMEDETOMIDINE HCL IN NACL 200 MCG/50ML IV SOLN
INTRAVENOUS | Status: DC | PRN
  Administered 2018-01-12 (×6): 8 ug via INTRAVENOUS

## 2018-01-12 MED ORDER — FENTANYL CITRATE (PF) 250 MCG/5ML IJ SOLN
INTRAMUSCULAR | Status: DC | PRN
  Administered 2018-01-12 (×5): 50 ug via INTRAVENOUS

## 2018-01-12 MED ORDER — POTASSIUM CHLORIDE CRYS ER 20 MEQ PO TBCR
20.0000 meq | EXTENDED_RELEASE_TABLET | Freq: Three times a day (TID) | ORAL | Status: AC
  Administered 2018-01-12 – 2018-01-13 (×3): 20 meq via ORAL
  Filled 2018-01-12 (×3): qty 1

## 2018-01-12 MED ORDER — LIDOCAINE 2% (20 MG/ML) 5 ML SYRINGE
INTRAMUSCULAR | Status: DC | PRN
  Administered 2018-01-12: 60 mg via INTRAVENOUS

## 2018-01-12 MED ORDER — HYDROMORPHONE HCL 2 MG/ML IJ SOLN
0.2500 mg | INTRAMUSCULAR | Status: DC | PRN
Start: 2018-01-12 — End: 2018-01-12

## 2018-01-12 MED ORDER — LIDOCAINE 2% (20 MG/ML) 5 ML SYRINGE
INTRAMUSCULAR | Status: AC
  Filled 2018-01-12: qty 5

## 2018-01-12 MED ORDER — HYDROMORPHONE HCL 1 MG/ML IJ SOLN
INTRAMUSCULAR | Status: DC | PRN
  Administered 2018-01-12: 0.5 mg via INTRAVENOUS

## 2018-01-12 MED ORDER — HYDROMORPHONE HCL 1 MG/ML IJ SOLN
INTRAMUSCULAR | Status: AC
  Filled 2018-01-12: qty 0.5

## 2018-01-12 MED ORDER — DEXMEDETOMIDINE HCL IN NACL 200 MCG/50ML IV SOLN
INTRAVENOUS | Status: AC
  Filled 2018-01-12: qty 50

## 2018-01-12 MED ORDER — MIDAZOLAM HCL 2 MG/2ML IJ SOLN
INTRAMUSCULAR | Status: DC | PRN
  Administered 2018-01-12: 2 mg via INTRAVENOUS

## 2018-01-12 MED ORDER — DOCUSATE SODIUM 100 MG PO CAPS
100.0000 mg | ORAL_CAPSULE | Freq: Two times a day (BID) | ORAL | Status: DC
  Administered 2018-01-12 – 2018-01-16 (×8): 100 mg via ORAL
  Filled 2018-01-12 (×10): qty 1

## 2018-01-12 MED ORDER — LACTATED RINGERS IV SOLN
INTRAVENOUS | Status: DC
  Administered 2018-01-12 – 2018-01-14 (×4): via INTRAVENOUS

## 2018-01-12 SURGICAL SUPPLY — 28 items
CANISTER SUCT 3000ML PPV (MISCELLANEOUS) ×2 IMPLANT
COVER SURGICAL LIGHT HANDLE (MISCELLANEOUS) ×2 IMPLANT
DRSG COVADERM 4X10 (GAUZE/BANDAGES/DRESSINGS) ×2 IMPLANT
DRSG COVADERM 4X14 (GAUZE/BANDAGES/DRESSINGS) ×1 IMPLANT
DRSG VAC ATS LRG SENSATRAC (GAUZE/BANDAGES/DRESSINGS) ×1 IMPLANT
DRSG VAC ATS MED SENSATRAC (GAUZE/BANDAGES/DRESSINGS) ×1 IMPLANT
ELECT REM PT RETURN 9FT ADLT (ELECTROSURGICAL) ×2
ELECTRODE REM PT RTRN 9FT ADLT (ELECTROSURGICAL) ×1 IMPLANT
GAUZE SPONGE 4X4 12PLY STRL (GAUZE/BANDAGES/DRESSINGS) ×1 IMPLANT
GLOVE BIOGEL PI IND STRL 7.5 (GLOVE) ×1 IMPLANT
GLOVE BIOGEL PI INDICATOR 7.5 (GLOVE) ×1
GOWN STRL REUS W/ TWL LRG LVL3 (GOWN DISPOSABLE) ×3 IMPLANT
GOWN STRL REUS W/TWL LRG LVL3 (GOWN DISPOSABLE) ×6
HANDPIECE INTERPULSE COAX TIP (DISPOSABLE)
IV NS IRRIG 3000ML ARTHROMATIC (IV SOLUTION) IMPLANT
KIT BASIN OR (CUSTOM PROCEDURE TRAY) ×2 IMPLANT
KIT TURNOVER KIT B (KITS) ×2 IMPLANT
MANIFOLD NEPTUNE II (INSTRUMENTS) IMPLANT
NS IRRIG 1000ML POUR BTL (IV SOLUTION) ×2 IMPLANT
PACK GENERAL/GYN (CUSTOM PROCEDURE TRAY) ×2 IMPLANT
PACK UNIVERSAL I (CUSTOM PROCEDURE TRAY) IMPLANT
PAD ARMBOARD 7.5X6 YLW CONV (MISCELLANEOUS) ×4 IMPLANT
PAD NEG PRESSURE SENSATRAC (MISCELLANEOUS) ×2 IMPLANT
SET HNDPC FAN SPRY TIP SCT (DISPOSABLE) IMPLANT
SUT ETHILON 3 0 FSL (SUTURE) ×5 IMPLANT
SUT ETHILON 3 0 PS 1 (SUTURE) ×1 IMPLANT
TOWEL GREEN STERILE (TOWEL DISPOSABLE) ×2 IMPLANT
WATER STERILE IRR 1000ML POUR (IV SOLUTION) IMPLANT

## 2018-01-12 NOTE — Transfer of Care (Signed)
Immediate Anesthesia Transfer of Care Note  Patient: Antonio Torres XXXEvans  Procedure(s) Performed: EXCHANGE OF WOUND VAC (Left Leg Lower)  Patient Location: PACU  Anesthesia Type:General  Level of Consciousness: drowsy  Airway & Oxygen Therapy: Patient Spontanous Breathing and Patient connected to face mask oxygen  Post-op Assessment: Report given to RN and Post -op Vital signs reviewed and stable  Post vital signs: Reviewed and stable  Last Vitals:  Vitals Value Taken Time  BP 93/55 01/12/2018 11:43 AM  Temp    Pulse 97 01/12/2018 11:48 AM  Resp 12 01/12/2018 11:48 AM  SpO2 96 % 01/12/2018 11:48 AM  Vitals shown include unvalidated device data.  Last Pain:  Vitals:   01/12/18 0429  TempSrc: Oral  PainSc: 4       Patients Stated Pain Goal: 3 (01/11/18 2055)  Complications: No apparent anesthesia complications

## 2018-01-12 NOTE — Progress Notes (Signed)
Patient still off the unit in OR. Patient's mother, Perrin Maltesenida Perry [phone] called to inquire on patient's progress. RN unable to provide information as no report received at this time. Mom encouraged to call back later.

## 2018-01-12 NOTE — Progress Notes (Signed)
Upon arrival to patient's room, patient lying in bed, upset and complaining about staff " waking me up all night. I can't get any damn sleep." RN explained to Mr. Antonio Torres profanity isn't neccessary, and our job is to take care of him. Patient continued to complain and stated he's tired of everyone saying something different regarding his care. Patient stated, I've had eight nurses since I've been here, why didn't they let me choose who I wanted today? RN explained she had no knowledge of this and explained to Mr. Antonio Torres writer is his nurse today and the plan is to provide him the care he needs.
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
## 2018-01-12 NOTE — Progress Notes (Signed)
Patient arrived in via bed from PACU. RN assisted PACU Nurse to patient's room. While attempting to place patient back on the monitor, patient began asking for his "flip phone." RN explained informed patient his phone was given to family member per his request as he left the unit going to OR. Patient began yelling and stated..." His ass know he better not have taken my phone away from me. I worked hard to get that Public house managerdamn phone" Writer asked patient to calm down, stop using profanity and try not to stress over a phone, as at this time, that isn't a priority.  Patient continued to yell, he then sat up in bed threatneing to leave if he didn't get his phone. Writer instructed him to lay down and stop yelling. Patient asked for [facility] his nurse from yesterday. Writer explained [facility] wasn't a Engineer, civil (consulting)nurse. Patient then asked writer her title, Clinical research associatewriter stated her credentials and explained she was his nurse and there the take care of him. Patient requesting another nurse. Writer acknowledged his request. Meanwhile, Clinical research associatewriter called patient's mom and asked her to speak with him. While talking to his mom, patient continued to be angry and still using profanity. Writer heard patient's mom tell him he needed to calm down and allow the staff to do his job. Patient responded by saying..." I don't want no damn nurse coming in here just to be a nurse." .   Patient's mom explained he needed to allow staff to care for him and not expect everyone to come in with the same personality.   As Clinical research associatewriter continued placing patient on the monitor and getting him settled in, the PACU nurse began walking around the room looking for patient's personal phone. Patient's phone was noted in a recliner chair near the window on the other side of patient's room.    After settling patient back in and reconnecting his PCA with second nurse, RN asked if previous RN would resume responsibiotiy of Mr. Logan Boresvans per his request.   Writer left patient's room and  updated his mother via phone.

## 2018-01-12 NOTE — Anesthesia Procedure Notes (Signed)
Procedure Name: LMA Insertion Date/Time: 01/12/2018 10:25 AM Performed by: Yolonda Kidaarver, Edee Nifong L, CRNA Pre-anesthesia Checklist: Patient identified, Emergency Drugs available, Suction available and Patient being monitored Patient Re-evaluated:Patient Re-evaluated prior to induction Oxygen Delivery Method: Circle system utilized Preoxygenation: Pre-oxygenation with 100% oxygen Induction Type: IV induction LMA: LMA inserted LMA Size: 4.0 Number of attempts: 1 Placement Confirmation: positive ETCO2,  CO2 detector and breath sounds checked- equal and bilateral Tube secured with: Tape Dental Injury: Teeth and Oropharynx as per pre-operative assessment

## 2018-01-12 NOTE — Progress Notes (Signed)
PT Cancellation Note  Patient Details Name: Rella Larvereavon R XXXEvans MRN: [id number] DOB: 10/01/1995   Cancelled Treatment:    Reason Eval/Treat Not Completed: Patient at procedure or test/unavailable.  Pt in surgery for ?VAC change and revision.  Will not be able to see pt today.  Will see as able 6/5 01/12/2018  [place] BingKen [place] Dost, PT [phone]  (pager)   Eliseo GumKenneth V Rithvik Orcutt 01/12/2018, 12:49 PM

## 2018-01-12 NOTE — Anesthesia Preprocedure Evaluation (Signed)
Anesthesia Evaluation  Patient identified by MRN, date of birth, ID band Patient awake    Reviewed: Allergy & Precautions, H&P , NPO status , Patient's Chart, lab work & pertinent test results  Airway Mallampati: II  TM Distance: >3 FB Neck ROM: Full    Dental no notable dental hx. (+) Teeth Intact, Dental Advisory Given   Pulmonary neg pulmonary ROS,    Pulmonary exam normal breath sounds clear to auscultation       Cardiovascular negative cardio ROS   Rhythm:Regular Rate:Normal     Neuro/Psych negative neurological ROS  negative psych ROS   GI/Hepatic negative GI ROS, Neg liver ROS,   Endo/Other  negative endocrine ROS  Renal/GU negative Renal ROS  negative genitourinary   Musculoskeletal   Abdominal   Peds  Hematology negative hematology ROS (+)   Anesthesia Other Findings   Reproductive/Obstetrics negative OB ROS                            Anesthesia Physical Anesthesia Plan  ASA: I  Anesthesia Plan: General   Post-op Pain Management:    Induction: Intravenous  PONV Risk Score and Plan: 3 and Ondansetron, Dexamethasone and Midazolam  Airway Management Planned: LMA  Additional Equipment:   Intra-op Plan:   Post-operative Plan: Extubation in OR  Informed Consent: I have reviewed the patients History and Physical, chart, labs and discussed the procedure including the risks, benefits and alternatives for the proposed anesthesia with the patient or authorized representative who has indicated his/her understanding and acceptance.     Dental advisory given  Plan Discussed with: CRNA  Anesthesia Plan Comments:         Anesthesia Quick Evaluation  

## 2018-01-12 NOTE — Anesthesia Postprocedure Evaluation (Signed)
Anesthesia Post Note  Patient: Antonio Torres  Procedure(s) Performed: EXCHANGE OF WOUND VAC (Left Leg Lower)     Patient location during evaluation: PACU Anesthesia Type: General Level of consciousness: awake and alert Pain management: pain level controlled Vital Signs Assessment: post-procedure vital signs reviewed and stable Respiratory status: spontaneous breathing, nonlabored ventilation and respiratory function stable Cardiovascular status: blood pressure returned to baseline and stable Postop Assessment: no apparent nausea or vomiting Anesthetic complications: no    Last Vitals:  Vitals:   01/12/18 1230 01/12/18 1234  BP: (!) 106/37 107/66  Pulse: 81   Resp: 14 17  Temp:  (!) 36.4 C  SpO2: 100% 99%    Last Pain:  Vitals:   01/12/18 1230  TempSrc:   PainSc: 0-No pain                 Damont Balles,W. EDMOND
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
## 2018-01-12 NOTE — Progress Notes (Signed)
OT Cancellation Note  Patient Details Name: Antonio Torres MRN: [id number] DOB: 04/04/1996   Cancelled Treatment:    Reason Eval/Treat Not Completed: Patient at procedure or test/ unavailable. OT /PT currently in OR. Therapy to re attempt at a more appropriate time.   Boone MasterJones, Jaidin Ugarte B 01/12/2018, 11:06 AM   Mateo FlowJones, Brynn   OTR/L Pager: [phone] Office: [phone] .'

## 2018-01-13 ENCOUNTER — Encounter (HOSPITAL_COMMUNITY): Payer: Self-pay | Admitting: Vascular Surgery

## 2018-01-13 LAB — BASIC METABOLIC PANEL
Anion gap: 6 (ref 5–15)
BUN: 12 mg/dL (ref 6–20)
CALCIUM: 8.5 mg/dL — AB (ref 8.9–10.3)
CHLORIDE: 102 mmol/L (ref 101–111)
CO2: 28 mmol/L (ref 22–32)
CREATININE: 0.79 mg/dL (ref 0.61–1.24)
GFR calc Af Amer: >60 mL/min (ref >60)
GFR calc non Af Amer: >60 mL/min (ref >60)
Glucose, Bld: 102 mg/dL — ABNORMAL HIGH (ref 65–99)
Potassium: 3.9 mmol/L (ref 3.5–5.1)
SODIUM: 136 mmol/L (ref 135–145)

## 2018-01-13 LAB — CBC
HCT: 25.3 % — ABNORMAL LOW (ref 39.0–52.0)
Hemoglobin: 8.4 g/dL — ABNORMAL LOW (ref 13.0–17.0)
MCH: 28.6 pg (ref 26.0–34.0)
MCHC: 33.2 g/dL (ref 30.0–36.0)
MCV: 86.1 fL (ref 78.0–100.0)
Platelets: 277 10*3/uL (ref 150–400)
RBC: 2.94 MIL/uL — ABNORMAL LOW (ref 4.22–5.81)
RDW: 15.5 % (ref 11.5–15.5)
WBC: 8.4 10*3/uL (ref 4.0–10.5)

## 2018-01-13 MED ORDER — FENTANYL CITRATE (PF) 100 MCG/2ML IJ SOLN
25.0000 ug | INTRAMUSCULAR | Status: DC | PRN
  Administered 2018-01-13 – 2018-01-14 (×5): 25 ug via INTRAVENOUS
  Filled 2018-01-13 (×7): qty 2

## 2018-01-13 MED ORDER — CEFAZOLIN SODIUM-DEXTROSE 2-4 GM/100ML-% IV SOLN
2.0000 g | INTRAVENOUS | Status: AC
  Administered 2018-01-14: 2 g via INTRAVENOUS
  Filled 2018-01-13: qty 100

## 2018-01-13 MED ORDER — BISACODYL 5 MG PO TBEC
10.0000 mg | DELAYED_RELEASE_TABLET | Freq: Every day | ORAL | Status: DC
  Administered 2018-01-15: 10 mg via ORAL
  Filled 2018-01-13 (×4): qty 2
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
## 2018-01-13 NOTE — Consult Note (Signed)
WOC Nurse wound consult note Reason for Consult: Spoke with Primary RN, Delaney Meigsamara.  She has conferred with surgical team and [facility], therefore I am cancelling the order. Helmut MusterSherry Ashlynn Gunnels, RN, MSN, CWOCN, CNS-BC, pager [phone]

## 2018-01-13 NOTE — Progress Notes (Signed)
  Progress Note    01/13/2018 7:53 AM 1 Day Post-Op  Subjective:  No acute issues  Vitals:   01/13/18 0427 01/13/18 0735  BP:    Pulse:    Resp: 17 20  Temp:    SpO2: 99% 99%    Physical Exam: aaox3 Palpable left dp  Improved movement of left foot  CBC    Component Value Date/Time   WBC 7.0 01/12/2018 0248   RBC 3.38 (L) 01/12/2018 0248   HGB 9.4 (L) 01/12/2018 0248   HCT 29.0 (L) 01/12/2018 0248   PLT 235 01/12/2018 0248   MCV 85.8 01/12/2018 0248   MCH 27.8 01/12/2018 0248   MCHC 32.4 01/12/2018 0248   RDW 15.1 01/12/2018 0248   LYMPHSABS 0.9 01/08/2018 1545   MONOABS 0.8 01/08/2018 1545   EOSABS 0.2 01/08/2018 1545   BASOSABS 0.0 01/08/2018 1545    BMET    Component Value Date/Time   NA 136 01/12/2018 0248   K 3.4 (L) 01/12/2018 0248   CL 100 (L) 01/12/2018 0248   CO2 29 01/12/2018 0248   GLUCOSE 101 (H) 01/12/2018 0248   BUN 12 01/12/2018 0248   CREATININE 0.81 01/12/2018 0248   CALCIUM 8.9 01/12/2018 0248   GFRNONAA >60 01/12/2018 0248   GFRAA >60 01/12/2018 0248    INR    Component Value Date/Time   INR 1.09 01/12/2018 0248     Intake/Output Summary (Last 24 hours) at 01/13/2018 0753 Last data filed at 01/13/2018 0434 Gross per 24 hour  Intake 1560 ml  Output 1650 ml  Net -90 ml     Assessment:  22 y.o. male is s/p closure of left medial thigh and lateral leg wounds.   Plan: OR tomorrow for washout and possible closure of fasciotomy sites Continue foot drop boot.    Antonio Torres C. Randie Heinzain, MD [facility] [place] Office: [phone] Pager: [phone]  01/13/2018 7:53 AM

## 2018-01-13 NOTE — Progress Notes (Signed)
Wasted 2.5 ml of fentanyl (PCA) in the sink in 4NP medication room.    Witnessed for [facility] Harris,RN
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
## 2018-01-13 NOTE — Progress Notes (Signed)
PT refused VS and  AM dressing change. RN will discuss with dayshift abt doing dressing change later in the AM and will change dressing change times to 8am and 8 pm. Pt states he does not want his AM vital signs taken anymore. PT is on cardiac monitor. Will continue to monitor.
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
## 2018-01-14 ENCOUNTER — Inpatient Hospital Stay (HOSPITAL_COMMUNITY): Payer: Medicaid Other | Admitting: Certified Registered"

## 2018-01-14 ENCOUNTER — Encounter (HOSPITAL_COMMUNITY): Admission: EM | Disposition: A | Discharge status: Home Health | Payer: Self-pay | Source: Home / Self Care

## 2018-01-14 HISTORY — PX: FASCIOTOMY CLOSURE: SHX5829

## 2018-01-14 LAB — BASIC METABOLIC PANEL
Anion gap: 7 (ref 5–15)
BUN: 14 mg/dL (ref 6–20)
CHLORIDE: 103 mmol/L (ref 101–111)
CO2: 27 mmol/L (ref 22–32)
CREATININE: 0.74 mg/dL (ref 0.61–1.24)
Calcium: 8.6 mg/dL — ABNORMAL LOW (ref 8.9–10.3)
GFR calc non Af Amer: >60 mL/min (ref >60)
Glucose, Bld: 94 mg/dL (ref 65–99)
Potassium: 3.9 mmol/L (ref 3.5–5.1)
Sodium: 137 mmol/L (ref 135–145)

## 2018-01-14 LAB — CBC
HEMATOCRIT: 26.1 % — AB (ref 39.0–52.0)
HEMOGLOBIN: 8.4 g/dL — AB (ref 13.0–17.0)
MCH: 27.6 pg (ref 26.0–34.0)
MCHC: 32.2 g/dL (ref 30.0–36.0)
MCV: 85.9 fL (ref 78.0–100.0)
Platelets: 317 10*3/uL (ref 150–400)
RBC: 3.04 MIL/uL — ABNORMAL LOW (ref 4.22–5.81)
RDW: 15.8 % — ABNORMAL HIGH (ref 11.5–15.5)
WBC: 8.6 10*3/uL (ref 4.0–10.5)

## 2018-01-14 LAB — PROTIME-INR
INR: 1.03
Prothrombin Time: 13.4 seconds (ref 11.4–15.2)

## 2018-01-14 SURGERY — FASCIOTOMY CLOSURE
Anesthesia: General | Site: Leg Lower | Laterality: Left

## 2018-01-14 MED ORDER — FENTANYL CITRATE (PF) 100 MCG/2ML IJ SOLN
INTRAMUSCULAR | Status: AC
  Administered 2018-01-14: 50 ug
  Filled 2018-01-14: qty 4

## 2018-01-14 MED ORDER — ONDANSETRON HCL 4 MG/2ML IJ SOLN: 4.0000 mg | Freq: Once | INTRAMUSCULAR | Status: DC | PRN

## 2018-01-14 MED ORDER — ONDANSETRON HCL 4 MG/2ML IJ SOLN
INTRAMUSCULAR | Status: AC
  Filled 2018-01-14: qty 2

## 2018-01-14 MED ORDER — 0.9 % SODIUM CHLORIDE (POUR BTL) OPTIME
TOPICAL | Status: DC | PRN
  Administered 2018-01-14: 1000 mL

## 2018-01-14 MED ORDER — ONDANSETRON HCL 4 MG/2ML IJ SOLN
4.0000 mg | Freq: Four times a day (QID) | INTRAMUSCULAR | Status: DC | PRN
  Administered 2018-01-14: 4 mg via INTRAVENOUS
  Filled 2018-01-14: qty 2

## 2018-01-14 MED ORDER — PROPOFOL 10 MG/ML IV BOLUS
INTRAVENOUS | Status: AC
  Filled 2018-01-14: qty 20

## 2018-01-14 MED ORDER — HYDROMORPHONE HCL 1 MG/ML IJ SOLN
1.0000 mg | INTRAMUSCULAR | Status: DC | PRN
  Administered 2018-01-14 – 2018-01-16 (×5): 2 mg via INTRAVENOUS
  Filled 2018-01-14 (×6): qty 2

## 2018-01-14 MED ORDER — DEXMEDETOMIDINE HCL IN NACL 200 MCG/50ML IV SOLN
INTRAVENOUS | Status: DC | PRN
  Administered 2018-01-14 (×5): 4 ug via INTRAVENOUS

## 2018-01-14 MED ORDER — PROPOFOL 10 MG/ML IV BOLUS
INTRAVENOUS | Status: DC | PRN
  Administered 2018-01-14: 200 mg via INTRAVENOUS

## 2018-01-14 MED ORDER — FENTANYL CITRATE (PF) 100 MCG/2ML IJ SOLN
25.0000 ug | INTRAMUSCULAR | Status: DC | PRN
  Administered 2018-01-14 (×2): 50 ug via INTRAVENOUS

## 2018-01-14 MED ORDER — LIDOCAINE HCL (CARDIAC) PF 100 MG/5ML IV SOSY
PREFILLED_SYRINGE | INTRAVENOUS | Status: DC | PRN
  Administered 2018-01-14: 40 mg via INTRAVENOUS

## 2018-01-14 MED ORDER — LIDOCAINE 2% (20 MG/ML) 5 ML SYRINGE
INTRAMUSCULAR | Status: AC
  Filled 2018-01-14: qty 5

## 2018-01-14 MED ORDER — OXYCODONE HCL 5 MG/5ML PO SOLN: 5.0000 mg | Freq: Once | ORAL | Status: DC | PRN

## 2018-01-14 MED ORDER — DEXAMETHASONE SODIUM PHOSPHATE 10 MG/ML IJ SOLN
INTRAMUSCULAR | Status: AC
  Filled 2018-01-14: qty 1

## 2018-01-14 MED ORDER — OXYCODONE HCL 5 MG PO TABS: 5.0000 mg | ORAL_TABLET | Freq: Once | ORAL | Status: DC | PRN

## 2018-01-14 MED ORDER — MIDAZOLAM HCL 5 MG/5ML IJ SOLN
INTRAMUSCULAR | Status: DC | PRN
  Administered 2018-01-14: 2 mg via INTRAVENOUS

## 2018-01-14 MED ORDER — HYDROMORPHONE HCL 1 MG/ML IJ SOLN
1.0000 mg | Freq: Once | INTRAMUSCULAR | Status: AC
  Administered 2018-01-14: 1 mg via INTRAVENOUS
  Filled 2018-01-14: qty 1

## 2018-01-14 MED ORDER — LORAZEPAM 2 MG/ML IJ SOLN
1.0000 mg | Freq: Once | INTRAMUSCULAR | Status: AC
  Administered 2018-01-14: 1 mg via INTRAVENOUS
  Filled 2018-01-14: qty 1

## 2018-01-14 MED ORDER — FENTANYL CITRATE (PF) 250 MCG/5ML IJ SOLN
INTRAMUSCULAR | Status: AC
  Filled 2018-01-14: qty 5

## 2018-01-14 MED ORDER — ONDANSETRON HCL 4 MG/2ML IJ SOLN
INTRAMUSCULAR | Status: DC | PRN
  Administered 2018-01-14: 4 mg via INTRAVENOUS

## 2018-01-14 MED ORDER — DEXAMETHASONE SODIUM PHOSPHATE 10 MG/ML IJ SOLN
INTRAMUSCULAR | Status: DC | PRN
  Administered 2018-01-14: 10 mg via INTRAVENOUS

## 2018-01-14 MED ORDER — OXYCODONE HCL 5 MG PO TABS
ORAL_TABLET | ORAL | Status: AC
  Filled 2018-01-14: qty 1

## 2018-01-14 MED ORDER — METHOCARBAMOL 500 MG PO TABS
500.0000 mg | ORAL_TABLET | Freq: Three times a day (TID) | ORAL | Status: DC
  Administered 2018-01-14 – 2018-01-17 (×8): 500 mg via ORAL
  Filled 2018-01-14 (×9): qty 1

## 2018-01-14 MED ORDER — MIDAZOLAM HCL 2 MG/2ML IJ SOLN
INTRAMUSCULAR | Status: AC
  Filled 2018-01-14: qty 2

## 2018-01-14 MED ORDER — FENTANYL CITRATE (PF) 100 MCG/2ML IJ SOLN
INTRAMUSCULAR | Status: DC | PRN
  Administered 2018-01-14 (×5): 50 ug via INTRAVENOUS

## 2018-01-14 MED ORDER — TRAMADOL HCL 50 MG PO TABS
50.0000 mg | ORAL_TABLET | Freq: Four times a day (QID) | ORAL | Status: DC
  Administered 2018-01-14 – 2018-01-15 (×4): 50 mg via ORAL
  Filled 2018-01-14 (×4): qty 1

## 2018-01-14 SURGICAL SUPPLY — 37 items
BANDAGE ACE 6X5 VEL STRL LF (GAUZE/BANDAGES/DRESSINGS) ×3 IMPLANT
BNDG CMPR MED 15X6 ELC VLCR LF (GAUZE/BANDAGES/DRESSINGS) ×2
BNDG ELASTIC 6X15 VLCR STRL LF (GAUZE/BANDAGES/DRESSINGS) ×3 IMPLANT
BNDG GAUZE ELAST 4 BULKY (GAUZE/BANDAGES/DRESSINGS) ×6 IMPLANT
CANISTER SUCT 3000ML PPV (MISCELLANEOUS) ×4 IMPLANT
COVER SURGICAL LIGHT HANDLE (MISCELLANEOUS) ×4 IMPLANT
DRAPE LAPAROSCOPIC ABDOMINAL (DRAPES) IMPLANT
DRAPE ORTHO SPLIT 77X108 STRL (DRAPES) ×8
DRAPE SURG ORHT 6 SPLT 77X108 (DRAPES) ×2 IMPLANT
DRAPE WARM FLUID 44X44 (DRAPE) ×4 IMPLANT
DRSG PAD ABDOMINAL 8X10 ST (GAUZE/BANDAGES/DRESSINGS) ×12 IMPLANT
DRSG VAC ATS LRG SENSATRAC (GAUZE/BANDAGES/DRESSINGS) IMPLANT
DRSG VAC ATS MED SENSATRAC (GAUZE/BANDAGES/DRESSINGS) IMPLANT
ELECT REM PT RETURN 9FT ADLT (ELECTROSURGICAL) ×4
ELECTRODE REM PT RTRN 9FT ADLT (ELECTROSURGICAL) ×2 IMPLANT
GAUZE SPONGE 4X4 12PLY STRL LF (GAUZE/BANDAGES/DRESSINGS) ×3 IMPLANT
GLOVE BIO SURGEON STRL SZ7 (GLOVE) ×4 IMPLANT
GLOVE BIOGEL PI IND STRL 7.5 (GLOVE) ×2 IMPLANT
GLOVE BIOGEL PI INDICATOR 7.5 (GLOVE) ×2
GOWN STRL NON-REIN LRG LVL3 (GOWN DISPOSABLE) ×3 IMPLANT
GOWN STRL REUS W/ TWL LRG LVL3 (GOWN DISPOSABLE) ×6 IMPLANT
GOWN STRL REUS W/TWL LRG LVL3 (GOWN DISPOSABLE) ×12
KIT BASIN OR (CUSTOM PROCEDURE TRAY) ×4 IMPLANT
KIT TURNOVER KIT B (KITS) ×4 IMPLANT
MARKER SKIN DUAL TIP RULER LAB (MISCELLANEOUS) ×3 IMPLANT
NS IRRIG 1000ML POUR BTL (IV SOLUTION) ×4 IMPLANT
PACK GENERAL/GYN (CUSTOM PROCEDURE TRAY) ×4 IMPLANT
PAD ARMBOARD 7.5X6 YLW CONV (MISCELLANEOUS) ×8 IMPLANT
PAD NEG PRESSURE SENSATRAC (MISCELLANEOUS) ×4 IMPLANT
RETAINER VISCERA MED (MISCELLANEOUS) IMPLANT
STAPLER VISISTAT 35W (STAPLE) ×6 IMPLANT
SUT ETHILON 2 0 PSLX (SUTURE) ×24 IMPLANT
SUT ETHILON 3 0 FSL (SUTURE) ×3 IMPLANT
SUT PDS AB 1 TP1 96 (SUTURE) IMPLANT
TAPE CLOTH SURG 4X10 WHT LF (GAUZE/BANDAGES/DRESSINGS) ×3 IMPLANT
TOWEL GREEN STERILE (TOWEL DISPOSABLE) ×4 IMPLANT
WATER STERILE IRR 1000ML POUR (IV SOLUTION) IMPLANT
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
## 2018-01-14 NOTE — Progress Notes (Signed)
Pt refuse to sign consent until he meets Dr. Imogene Burnhen prior to surgery. Short stay nurse made aware and consent sent with patient.
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
## 2018-01-14 NOTE — Progress Notes (Addendum)
1545 Bedside shift report. Pt resting in bed, male friend in bed with him, and father at bedside. No complaints at time of shift change. WCTM.   1630 Pt partially assessed. Pt upset and getting angry about visitor restrictions. Pt tore off tele leads, O2 sat probe. Pt up with walker to bathroom with assistance of RN. Pt instructed RN to, not "watch me piss". Bathroom door closed and bed linen changed. Pt assisted back to bed. Fall precautions in place, WCTM.   1700 RN to room, bed alarm going off. Pt walking hallways with male friend and walker. Asking for PT. Ken at computer station. Ken reviewed NWB exercises with pt and informed him on the process of obtaining a walker, wheelchair, and other home equipment. Pt threatening to leave AMA, and also to leave unit to go downstairs to meet his brother. Security on unit, charge RN speaking to pt and security guards. Pt threw walker on floor and yelling and cursing at security guards. AC called and trauma MD called for possible discharge orders since pt will not sign out AMA. Dr. Magnus IvanBlackman returned call and will not discharge pt. Admission coordinator [facility](AC) and security guards in room speaking to pt about visitation. Pt started vomiting. MD paged for orders. WCTM.   1730 Dr. Magnus IvanBlackman paged again for 2nd time.   1840 Dr. Magnus IvanBlackman returned call and new orders received.   1915 Uncle at bedside, male friend and brother at bedside, instructed pt that only 1 visitor at time. Pt upset, angry, no reasoning with pt. AC again called.   1930 AC at bedside, explained visitation again with pt. Shift report given to Jasmine DecemberSharon, Charity fundraiserN.
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
## 2018-01-14 NOTE — Anesthesia Preprocedure Evaluation (Signed)

## 2018-01-14 NOTE — Transfer of Care (Signed)
Immediate Anesthesia Transfer of Care Note  Patient: Antonio Torres  Procedure(s) Performed: FASCIOTOMY CLOSURE Left upper medial and left upper lateral fasciotomy. (Left Leg Lower)  Patient Location: PACU  Anesthesia Type:General  Level of Consciousness: awake and patient cooperative  Airway & Oxygen Therapy: Patient Spontanous Breathing  Post-op Assessment: Report given to RN and Post -op Vital signs reviewed and stable  Post vital signs: Reviewed and stable  Last Vitals:  Vitals Value Taken Time  BP    Temp    Pulse 97 01/14/2018 10:19 AM  Resp 14 01/14/2018 10:19 AM  SpO2 100 % 01/14/2018 10:19 AM  Vitals shown include unvalidated device data.  Last Pain:  Vitals:   01/14/18 0433  TempSrc:   PainSc: Asleep      Patients Stated Pain Goal: 2 (01/14/18 0332)  Complications: No apparent anesthesia complications

## 2018-01-14 NOTE — Progress Notes (Signed)
PT Cancellation Note  Patient Details Name: Rella Larvereavon R XXXEvans MRN: [id number] DOB: 01/10/1996   Cancelled Treatment:    Reason Eval/Treat Not Completed: Pain limiting ability to participate.  Pt screaming and cursing in pain when arriving.  Will try back later today to see if pt able to participate. 01/14/2018  [place] BingKen Ivie Maese, PT [phone]  (pager)   Eliseo GumKenneth V Cherlyn Syring 01/14/2018, 1:26 PM
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
## 2018-01-14 NOTE — Anesthesia Postprocedure Evaluation (Signed)
Anesthesia Post Note  Patient: Antonio Torres  Procedure(s) Performed: FASCIOTOMY CLOSURE Left upper medial and left upper lateral fasciotomy. (Left Leg Lower)     Patient location during evaluation: PACU Anesthesia Type: General Level of consciousness: awake and alert Pain management: pain level controlled Vital Signs Assessment: post-procedure vital signs reviewed and stable Respiratory status: spontaneous breathing, nonlabored ventilation, respiratory function stable and patient connected to nasal cannula oxygen Cardiovascular status: blood pressure returned to baseline and stable Postop Assessment: no apparent nausea or vomiting Anesthetic complications: no    Last Vitals:  Vitals:   01/14/18 1209 01/14/18 1620  BP: 134/64 104/65  Pulse: 100 (!) 104  Resp: 17 16  Temp: 36.6 C 36.7 C  SpO2: 100% 100%    Last Pain:  Vitals:   01/14/18 1620  TempSrc: Oral  PainSc:                  Antonio Torres

## 2018-01-14 NOTE — Anesthesia Procedure Notes (Signed)
Procedure Name: LMA Insertion Date/Time: 01/14/2018 9:09 AM Performed by: Rosiland OzMeyers, Avia Merkley, CRNA Pre-anesthesia Checklist: Patient identified, Emergency Drugs available, Suction available, Patient being monitored and Timeout performed Patient Re-evaluated:Patient Re-evaluated prior to induction Oxygen Delivery Method: Circle system utilized Preoxygenation: Pre-oxygenation with 100% oxygen Induction Type: IV induction LMA: LMA inserted LMA Size: 4.0 Number of attempts: 1 Placement Confirmation: positive ETCO2 and breath sounds checked- equal and bilateral Tube secured with: Tape Dental Injury: Teeth and Oropharynx as per pre-operative assessment

## 2018-01-15 ENCOUNTER — Encounter (HOSPITAL_COMMUNITY): Payer: Self-pay | Admitting: Vascular Surgery

## 2018-01-15 LAB — CBC
HEMATOCRIT: 25.1 % — AB (ref 39.0–52.0)
HEMOGLOBIN: 8.2 g/dL — AB (ref 13.0–17.0)
MCH: 28.1 pg (ref 26.0–34.0)
MCHC: 32.7 g/dL (ref 30.0–36.0)
MCV: 86 fL (ref 78.0–100.0)
Platelets: 372 10*3/uL (ref 150–400)
RBC: 2.92 MIL/uL — ABNORMAL LOW (ref 4.22–5.81)
RDW: 15.8 % — AB (ref 11.5–15.5)
WBC: 11.9 10*3/uL — ABNORMAL HIGH (ref 4.0–10.5)

## 2018-01-15 MED ORDER — DIPHENHYDRAMINE HCL 25 MG PO CAPS
25.0000 mg | ORAL_CAPSULE | Freq: Four times a day (QID) | ORAL | Status: DC | PRN
  Administered 2018-01-15 – 2018-01-16 (×4): 25 mg via ORAL
  Filled 2018-01-15 (×4): qty 1
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
## 2018-01-15 NOTE — Progress Notes (Signed)
MD paged, pt voiced Fentanyl is not controlling his pain. Fentanyl discontinued and Dilaudid ordered. Dilaudid admin and pt voiced pain is 6/10 from 10/10

## 2018-01-15 NOTE — Progress Notes (Signed)
AC, Water quality scientistunit director, and RN in room to discuss visitation with patient. Patient states he wanted to leave at this time asking why he was still here, RN explained LLE dressing to be changed next day per MD note and then d/c would be evaluated.  Patient began unwrapping dressing stating he was leaving.  RN paged PA; PA spoke to pt on speakerphone with RN and [facility] in room explaining plan until d/c.  PA made it clear to pt he if he chose to leave AMA, he would not get equipment or pain medication.  Patient at this time is choosing to stay and await until d/c from MD team.  Patient continues to ambulate without assistance in room/hall and unplugging self from monitor. Patient has dedicated per unit director two visitors: Donnald GarreMiles White (brother) and Velda Shellmonte Thomas (friend-girl).

## 2018-01-15 NOTE — Progress Notes (Signed)
PHARMACIST - PHYSICIAN COMMUNICATION  CONCERNING:  IV to Oral Route Change Policy  RECOMMENDATION: This patient is receivingdyphenhydramineby the intravenous route. Based on criteria approved by the Pharmacy and Therapeutics Committee, the antibiotic(s) is/are being converted to the equivalent oral dose form(s).   DESCRIPTION:  All patients are eligible to for this conversion except:  Patients experiencing a severe allergic reaction, undergoing procedural sedation, or receiving premedication before a blood product or a biologic,  antimicrobial, or chemotherapy agent.   This patient does not meet exclusion criteria, thus this medication was switched to po.   If you have questions about this conversion, please contact the Pharmacy Department  [] [phone]( [phone] ) [facility] [] [phone]( [phone] ) [facility] [x] [phone]( [phone] ) [facility] [] [phone]( [phone] ) [facility] [] [phone]( [phone] ) [facility]   Adline PotterSabrina Jojo Geving, PharmD Pharmacy Resident Phone: [phone]

## 2018-01-16 MED ORDER — TRAMADOL HCL 50 MG PO TABS
100.0000 mg | ORAL_TABLET | Freq: Four times a day (QID) | ORAL | Status: DC
  Administered 2018-01-16 – 2018-01-17 (×2): 100 mg via ORAL
  Filled 2018-01-16 (×2): qty 2

## 2018-01-16 MED ORDER — GLYCERIN (LAXATIVE) 2.1 G RE SUPP
1.0000 | Freq: Once | RECTAL | Status: DC
  Filled 2018-01-16: qty 1

## 2018-01-16 MED ORDER — HYDROMORPHONE HCL 1 MG/ML IJ SOLN
1.0000 mg | INTRAMUSCULAR | Status: DC | PRN
  Administered 2018-01-16: 1 mg via INTRAVENOUS
  Filled 2018-01-16: qty 1
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
## 2018-01-16 NOTE — Progress Notes (Signed)
Patient states he does not want vital signs taken.  Has taken off monitor leads, B/P cuff, and oxygen probe.  Dressing changed to left leg with wound care teaching done.  Comprehension ascertained via "teachback"

## 2018-01-17 ENCOUNTER — Encounter (HOSPITAL_COMMUNITY): Payer: Self-pay

## 2018-01-17 MED ORDER — GABAPENTIN 300 MG PO CAPS: 300.0000 mg | ORAL_CAPSULE | Freq: Three times a day (TID) | ORAL | 1 refills | Status: AC

## 2018-01-17 MED ORDER — OXYCODONE HCL 5 MG PO TABS: 5.0000 mg | ORAL_TABLET | ORAL | 0 refills | Status: AC | PRN

## 2018-01-17 MED ORDER — TRAMADOL HCL 50 MG PO TABS: 100.0000 mg | ORAL_TABLET | Freq: Four times a day (QID) | ORAL | 1 refills | Status: AC

## 2018-01-17 MED ORDER — ACETAMINOPHEN 500 MG PO TABS: 1000.0000 mg | ORAL_TABLET | Freq: Three times a day (TID) | ORAL | 0 refills | Status: AC | PRN

## 2018-01-17 MED ORDER — METHOCARBAMOL 500 MG PO TABS: 500.0000 mg | ORAL_TABLET | Freq: Three times a day (TID) | ORAL | 1 refills | Status: AC | PRN
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
## 2018-01-17 NOTE — Progress Notes (Signed)
  Progress Note    01/17/2018 10:51 AM 3 Days Post-Op  Subjective:  No acute complaints  Vitals:   01/17/18 0000 01/17/18 0400  BP:    Pulse:    Resp: 14 14  Temp:    SpO2:      Physical Exam: aaox3 Non labored respirations Abdomen is soft Incisions of left thigh and leg are in tact Palpable left dp  CBC    Component Value Date/Time   WBC 11.9 (H) 01/15/2018 0250   RBC 2.92 (L) 01/15/2018 0250   HGB 8.2 (L) 01/15/2018 0250   HCT 25.1 (L) 01/15/2018 0250   PLT 372 01/15/2018 0250   MCV 86.0 01/15/2018 0250   MCH 28.1 01/15/2018 0250   MCHC 32.7 01/15/2018 0250   RDW 15.8 (H) 01/15/2018 0250   LYMPHSABS 0.9 01/08/2018 1545   MONOABS 0.8 01/08/2018 1545   EOSABS 0.2 01/08/2018 1545   BASOSABS 0.0 01/08/2018 1545    BMET    Component Value Date/Time   NA 137 01/14/2018 0330   K 3.9 01/14/2018 0330   CL 103 01/14/2018 0330   CO2 27 01/14/2018 0330   GLUCOSE 94 01/14/2018 0330   BUN 14 01/14/2018 0330   CREATININE 0.74 01/14/2018 0330   CALCIUM 8.6 (L) 01/14/2018 0330   GFRNONAA >60 01/14/2018 0330   GFRAA >60 01/14/2018 0330    INR    Component Value Date/Time   INR 1.03 01/14/2018 0330     Intake/Output Summary (Last 24 hours) at 01/17/2018 1051 Last data filed at 01/17/2018 0100 Gross per 24 hour  Intake 720 ml  Output 350 ml  Net 370 ml     Assessment:  22 y.o. male is s/p ligation of left femoral vein and fasciotomies  Plan: Ok for dc from vascular standpoint Will f/u in 3-4 weeks for suture removal   Felecity Lemaster C. Randie Heinzain, MD [facility] [place] Office: [phone] Pager: [phone]  01/17/2018 10:51 AM
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
## 2018-01-18 ENCOUNTER — Telehealth: Payer: Self-pay | Admitting: Vascular Surgery

## 2018-01-18 NOTE — Telephone Encounter (Signed)
sch appt phone NA 02/18/18 845am suture Removal

## 2018-02-18 ENCOUNTER — Encounter: Payer: Self-pay | Admitting: Vascular Surgery

## 2018-02-25 ENCOUNTER — Other Ambulatory Visit: Payer: Self-pay

## 2018-02-25 ENCOUNTER — Encounter: Payer: Self-pay | Admitting: Vascular Surgery

## 2018-02-25 ENCOUNTER — Ambulatory Visit (INDEPENDENT_AMBULATORY_CARE_PROVIDER_SITE_OTHER): Payer: Self-pay | Admitting: Vascular Surgery

## 2018-02-25 VITALS — BP 98/65 | HR 97 | Temp 99.6°F | Resp 16 | Ht 67.0 in | Wt 123.0 lb

## 2018-02-25 DIAGNOSIS — S75112D Minor laceration of femoral vein at hip and thigh level, left leg, subsequent encounter: Secondary | ICD-10-CM
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
## 2018-03-16 ENCOUNTER — Telehealth (HOSPITAL_COMMUNITY): Payer: Self-pay
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
## 2018-03-17 NOTE — Telephone Encounter (Signed)
Attempted to call bonnie. Left a message without pt information to have her page me with questions or concerns.  Mattie MarlinJessica Douglas Smolinsky, [facility] [place] [facility] Pager [phone]

## 2018-06-14 ENCOUNTER — Other Ambulatory Visit: Payer: Self-pay

## 2018-06-14 DIAGNOSIS — W3400XA Accidental discharge from unspecified firearms or gun, initial encounter: Secondary | ICD-10-CM

## 2018-06-14 DIAGNOSIS — S75112D Minor laceration of femoral vein at hip and thigh level, left leg, subsequent encounter: Secondary | ICD-10-CM

## 2018-06-23 ENCOUNTER — Telehealth: Payer: Self-pay | Admitting: Vascular Surgery

## 2018-06-23 ENCOUNTER — Other Ambulatory Visit: Payer: Self-pay

## 2018-06-23 DIAGNOSIS — W3400XA Accidental discharge from unspecified firearms or gun, initial encounter: Secondary | ICD-10-CM

## 2018-06-23 DIAGNOSIS — S75112D Minor laceration of femoral vein at hip and thigh level, left leg, subsequent encounter: Secondary | ICD-10-CM

## 2018-06-23 NOTE — Telephone Encounter (Signed)
On 06/23/18, our office called Mr. Antonio Torres to discuss his appointment (apparently scheduled through an error on our part) with Dr. Randie Heinzain on 11.15.19.  At the time of his last appointment, Dr. Randie Heinzain said that he could follow up PRN.  Mr. Antonio Torres says that he has blood clots in his legs that [facility] and possibly by CT.  He says his physician is prescribing blood thinners, but says that he can't keep prescribing them for longer than 3 months without knowing what is causing the blood clots.  The patient will have the reports from the ultrasounds and CT faxed to my attention ASAP.    Dr. Randie Heinzain was contacted and given the information above and said that the patient should return for a bilateral lower extremity venous ultrasound and visit with Dr. Randie Heinzain in the next 3 weeks .  These appointments were scheduled for December 6.    The patients was made aware of these appointments on 06/24/18.  He reported that he called [facility] to request the records.  I emphasized that the records would improve his 12/6 appointment.  I will mail an appointment reminder to him.

## 2018-06-24 ENCOUNTER — Inpatient Hospital Stay (HOSPITAL_COMMUNITY): Admission: RE | Admit: 2018-06-24 | Payer: Medicaid Other | Source: Ambulatory Visit

## 2018-06-24 ENCOUNTER — Ambulatory Visit: Payer: Self-pay | Admitting: Family

## 2018-07-15 ENCOUNTER — Encounter (HOSPITAL_COMMUNITY): Payer: Medicaid Other

## 2018-07-15 ENCOUNTER — Ambulatory Visit: Payer: Medicaid Other | Admitting: Vascular Surgery

## 2018-07-18 ENCOUNTER — Encounter: Payer: Self-pay | Admitting: Vascular Surgery

## 2018-08-26 ENCOUNTER — Other Ambulatory Visit (HOSPITAL_COMMUNITY): Payer: Medicaid Other

## 2018-08-26 ENCOUNTER — Ambulatory Visit: Payer: Medicaid Other | Admitting: Vascular Surgery

## 2019-09-18 ENCOUNTER — Other Ambulatory Visit: Payer: Self-pay | Admitting: Vascular Surgery

## 2019-09-18 DIAGNOSIS — S75112D Minor laceration of femoral vein at hip and thigh level, left leg, subsequent encounter: Secondary | ICD-10-CM

## 2019-09-18 DIAGNOSIS — W3400XA Accidental discharge from unspecified firearms or gun, initial encounter: Secondary | ICD-10-CM
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
